# Patient Record
Sex: Male | Born: 2000 | Race: Black or African American | Hispanic: No | Marital: Single | Smoking: Never smoker
Health system: Southern US, Community
[De-identification: ages and names within clinical notes are randomized; demographics above are authoritative.]

## PROBLEM LIST (undated history)

## (undated) DIAGNOSIS — L732 Hidradenitis suppurativa: Secondary | ICD-10-CM

## (undated) HISTORY — DX: Hidradenitis suppurativa: L73.2

---
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
## 2019-08-12 ENCOUNTER — Encounter (HOSPITAL_COMMUNITY): Payer: Self-pay | Admitting: Emergency Medicine

## 2019-08-12 ENCOUNTER — Emergency Department (HOSPITAL_COMMUNITY)
Admission: EM | Admit: 2019-08-12 | Discharge: 2019-08-13 | Disposition: A | Discharge status: Home / Self Care | Payer: Self-pay | Attending: Emergency Medicine | Admitting: Emergency Medicine

## 2019-08-12 DIAGNOSIS — Z5321 Procedure and treatment not carried out due to patient leaving prior to being seen by health care provider: Secondary | ICD-10-CM | POA: Insufficient documentation

## 2019-08-12 DIAGNOSIS — L02414 Cutaneous abscess of left upper limb: Secondary | ICD-10-CM | POA: Insufficient documentation

## 2019-08-12 NOTE — ED Triage Notes (Signed)
Pt c/o abscess under left arm for couple days. Repots some drainage from it.

## 2019-08-13 NOTE — ED Notes (Signed)
Pt called for rooming

## 2019-08-16 ENCOUNTER — Other Ambulatory Visit: Payer: Self-pay

## 2019-08-16 ENCOUNTER — Emergency Department (HOSPITAL_COMMUNITY)
Admission: EM | Admit: 2019-08-16 | Discharge: 2019-08-17 | Disposition: A | Discharge status: Home / Self Care | Payer: Self-pay | Attending: Emergency Medicine | Admitting: Emergency Medicine

## 2019-08-16 ENCOUNTER — Encounter (HOSPITAL_COMMUNITY): Payer: Self-pay

## 2019-08-16 DIAGNOSIS — L0291 Cutaneous abscess, unspecified: Secondary | ICD-10-CM

## 2019-08-16 DIAGNOSIS — L02412 Cutaneous abscess of left axilla: Secondary | ICD-10-CM | POA: Insufficient documentation

## 2019-08-16 MED ORDER — ACETAMINOPHEN 500 MG PO TABS
1000.0000 mg | ORAL_TABLET | Freq: Once | ORAL | Status: AC
  Administered 2019-08-16: 1000 mg via ORAL
  Filled 2019-08-16: qty 2

## 2019-08-16 MED ORDER — DOXYCYCLINE HYCLATE 100 MG PO CAPS: 100.0000 mg | ORAL_CAPSULE | Freq: Two times a day (BID) | ORAL | 0 refills | Status: AC

## 2019-08-16 MED ORDER — LIDOCAINE-EPINEPHRINE (PF) 2 %-1:200000 IJ SOLN
10.0000 mL | Freq: Once | INTRAMUSCULAR | Status: AC
  Administered 2019-08-16: 10 mL
  Filled 2019-08-16: qty 20

## 2019-08-16 MED ORDER — DOXYCYCLINE HYCLATE 100 MG PO TABS
100.0000 mg | ORAL_TABLET | Freq: Once | ORAL | Status: AC
  Administered 2019-08-16: 100 mg via ORAL
  Filled 2019-08-16: qty 1

## 2019-08-16 NOTE — ED Triage Notes (Signed)
Arrived POve fro m home. Patient reports abscess under left axilla. Patient was here in this ED a few days ago, but left because of long wait time. Patient states his mother tried to treat abscess at home with "bath salts", but it has gotten worse. Patient reports some drainage from abscess.

## 2019-08-16 NOTE — Discharge Instructions (Signed)
Please take all of your antibiotics until finished!   You may develop abdominal discomfort or diarrhea from the antibiotic.  You may help offset this with probiotics which you can buy or get in yogurt. Do not eat  or take the probiotics until 2 hours after your antibiotic.   Please return to the emergency department or urgent care or primary care doctor office in 48 hours for reevaluation.  You should have wound checks done to make sure the wound is healing well.

## 2019-08-16 NOTE — ED Provider Notes (Signed)
[facility]-EMERGENCY DEPT Provider Note   CSN: [id number] Arrival date & time: 08/16/19  1807     History Chief Complaint  Patient presents with  . Abscess    left axilla    Tommy Higgins is a 19 y.o. male.  HPI  Patient is a 19 year old male with no pertinent past medical history presented today for left axillary abscess.  He states it is achy, severely painful, worse with arm movement.  He states it is progressively worsened over the past 6 days.  He states that he came to the ER for evaluation a few days ago however left because of the wait time.  He has taken nothing for pain prior to arrival.  He states that he attempted to treat the abscess with warm salt water soaks and warm compresses but he states that it has continued to worsen.  He denies any nausea, vomiting, fevers, chills.  Denies any crepitations fatigue lightheadedness or dizziness.     History reviewed. No pertinent past medical history.  There are no problems to display for this patient.   No past surgical history on file.     No family history on file.  Social History   Tobacco Use  . Smoking status: Never Smoker  . Smokeless tobacco: Never Used  Substance Use Topics  . Alcohol use: Not on file  . Drug use: Not on file    Home Medications Prior to Admission medications   Medication Sig Start Date End Date Taking? Authorizing Provider  doxycycline (VIBRAMYCIN) 100 MG capsule Take 1 capsule (100 mg total) by mouth 2 (two) times daily for 10 days. 08/16/19 08/26/19  Gailen Shelter, PA  guaiFENesin (MUCINEX) 600 MG 12 hr tablet Take 600 mg by mouth 2 (two) times daily as needed for cough. Patient not taking: Reported on 08/16/2019    [provider]  ibuprofen (ADVIL,MOTRIN) 600 MG tablet Take 1 tablet (600 mg total) by mouth every 6 (six) hours as needed. Patient not taking: Reported on 08/16/2019 05/10/16   Ronnell Freshwater, NP    Allergies    Patient  has no known allergies.  Review of Systems   Review of Systems  Constitutional: Negative for chills and fever.  HENT: Negative for congestion.   Eyes: Negative for pain.  Respiratory: Negative for cough and shortness of breath.   Cardiovascular: Negative for chest pain and leg swelling.  Gastrointestinal: Negative for abdominal pain and vomiting.  Genitourinary: Negative for dysuria.  Musculoskeletal: Negative for myalgias.       Left armpit pain  Skin: Negative for rash.       Abscess  Neurological: Negative for dizziness and headaches.    Physical Exam Updated Vital Signs BP 125/84 (BP Location: Right Arm)   Pulse 89   Temp 98.4 F (36.9 C) (Oral)   Resp 20   Ht 6\' 2"  (1.88 m)   Wt 104.3 kg   SpO2 100%   BMI 29.53 kg/m   Physical Exam Vitals and nursing note reviewed.  Constitutional:      General: He is not in acute distress. HENT:     Head: Normocephalic and atraumatic.     Nose: Nose normal.     Mouth/Throat:     Mouth: Mucous membranes are moist.  Eyes:     General: No scleral icterus. Cardiovascular:     Rate and Rhythm: Normal rate and regular rhythm.     Pulses: Normal pulses.     Heart  sounds: Normal heart sounds.  Pulmonary:     Effort: Pulmonary effort is normal. No respiratory distress.     Breath sounds: No wheezing.  Abdominal:     Palpations: Abdomen is soft.     Tenderness: There is no abdominal tenderness.  Musculoskeletal:     Cervical back: Normal range of motion.     Right lower leg: No edema.     Left lower leg: No edema.  Skin:    General: Skin is warm and dry.     Capillary Refill: Capillary refill takes less than 2 seconds.     Comments: Large fluctuant 4 cm x 5 cm fluctuant area in the left axilla. There is surrounding erythema.  There is very tender to palpation.  Neurological:     Mental Status: He is alert. Mental status is at baseline.  Psychiatric:        Mood and Affect: Mood normal.        Behavior: Behavior normal.      ED Results / Procedures / Treatments   Labs (all labs ordered are listed, but only abnormal results are displayed) Labs Reviewed  AEROBIC CULTURE (SUPERFICIAL SPECIMEN)    EKG None  Radiology No results found.  Procedures .Marland KitchenIncision and Drainage  Date/Time: 08/17/2019 12:39 AM Performed by: Tedd Sias, PA Authorized by: Tedd Sias, PA   Consent:    Consent obtained:  Verbal   Consent given by:  Patient   Risks discussed:  Bleeding, incomplete drainage, pain and damage to other organs   Alternatives discussed:  No treatment Universal protocol:    Procedure explained and questions answered to patient or proxy's satisfaction: yes     Relevant documents present and verified: yes     Test results available and properly labeled: yes     Imaging studies available: yes     Required blood products, implants, devices, and special equipment available: yes     Site/side marked: yes     Immediately prior to procedure a time out was called: yes     Patient identity confirmed:  Verbally with patient Location:    Type:  Abscess   Size:  5x4cm   Location:  Upper extremity   Upper extremity location: Left axilla. Pre-procedure details:    Skin preparation:  Betadine Anesthesia (see MAR for exact dosages):    Anesthesia method:  Local infiltration   Local anesthetic:  Lidocaine 2% WITH epi Procedure type:    Complexity:  Complex Procedure details:    Incision type: Parallel straight incisions.   Incision depth:  Subcutaneous   Scalpel blade:  11   Wound management:  Probed and deloculated, irrigated with saline and extensive cleaning   Drainage:  Purulent and bloody   Drainage amount:  Copious   Wound treatment:  Wound left open and drain placed (Penrose drain placed) Post-procedure details:    Patient tolerance of procedure:  Tolerated well, no immediate complications   (including critical care time)  Medications Ordered in ED Medications   lidocaine-EPINEPHrine (XYLOCAINE W/EPI) 2 %-1:200000 (PF) injection 10 mL (10 mLs Infiltration Given 08/16/19 2246)  acetaminophen (TYLENOL) tablet 1,000 mg (1,000 mg Oral Given 08/16/19 2246)  doxycycline (VIBRA-TABS) tablet 100 mg (100 mg Oral Given 08/16/19 2246)    ED Course  I have reviewed the triage vital signs and the nursing notes.  Pertinent labs & imaging results that were available during my care of the patient were reviewed by me and considered in my medical decision  making (see chart for details).    MDM Rules/Calculators/A&P                          Patient is 19 year old male with 5 x 4 cm abscess in the left axilla.  No significant systemic symptoms.   Loop drainage technique was used with parallel incisions and a Penrose drain placed.  Purulent drainage was expressed from wound.  Area was deloculated and irrigated.  Penrose drain was placed and tied.  Patient will follow up with his primary care doctor, urgent care or this department in 2 days for wound recheck.  There is significant surrounding erythema patient was placed on doxycycline to cover for MRSA.  He was given wound care instructions and return precautions.  Wound culture obtained as this was an extensive abscess.   Final Clinical Impression(s) / ED Diagnoses Final diagnoses:  Abscess  Abscess of axilla, left    Rx / DC Orders ED Discharge Orders         Ordered    doxycycline (VIBRAMYCIN) 100 MG capsule  2 times daily     Discontinue  Reprint     08/16/19 2330           Gailen Shelter, [place] 08/17/19 0041    Vanetta Mulders, MD 08/25/19 1704

## 2019-08-19 ENCOUNTER — Encounter (HOSPITAL_COMMUNITY): Payer: Self-pay | Admitting: *Deleted

## 2019-08-19 ENCOUNTER — Emergency Department (HOSPITAL_COMMUNITY)
Admission: EM | Admit: 2019-08-19 | Discharge: 2019-08-19 | Disposition: A | Discharge status: Home / Self Care | Payer: Self-pay | Attending: Emergency Medicine | Admitting: Emergency Medicine

## 2019-08-19 ENCOUNTER — Other Ambulatory Visit: Payer: Self-pay

## 2019-08-19 DIAGNOSIS — Z4800 Encounter for change or removal of nonsurgical wound dressing: Secondary | ICD-10-CM | POA: Insufficient documentation

## 2019-08-19 DIAGNOSIS — Z5189 Encounter for other specified aftercare: Secondary | ICD-10-CM

## 2019-08-19 LAB — AEROBIC CULTURE W GRAM STAIN (SUPERFICIAL SPECIMEN)

## 2019-08-19 NOTE — Discharge Instructions (Signed)
Continue with warm compresses, the area will continue to drain and improve.  Make sure you take your entire course of antibiotics.  If you feel like the areas become increasingly painful, swollen, red with worsening drainage return to the ED for reevaluation.

## 2019-08-19 NOTE — ED Triage Notes (Signed)
Pt had abscess drained 2 days and had a drain placed. He was told to come back today and have it removed.

## 2019-08-19 NOTE — ED Provider Notes (Signed)
[facility] DEPT Provider Note   CSN: [id number] Arrival date & time: 08/19/19  2376     History Chief Complaint  Patient presents with  . Wound Check    Tommy Higgins is a 19 y.o. male.  Tommy Higgins is a 19 y.o. male who presents to the ED for wound check.  Patient was seen in the ED 2 days ago and had an abscess of the left axilla drained, and a Penrose loop drain was placed.  Patient returns today to have drain removed.  He reports the area is still draining a bit but is overall improving.  He reports improvement to pain and swelling overall.  Redness is pretty much resolved.  He was prescribed doxycycline and says he has been taking it regularly.  No fevers.  No other aggravating or alleviating factors.        History reviewed. No pertinent past medical history.  There are no problems to display for this patient.   History reviewed. No pertinent surgical history.     No family history on file.  Social History   Tobacco Use  . Smoking status: Never Smoker  . Smokeless tobacco: Never Used  Substance Use Topics  . Alcohol use: Not on file  . Drug use: Not on file    Home Medications Prior to Admission medications   Medication Sig Start Date End Date Taking? Authorizing Provider  doxycycline (VIBRAMYCIN) 100 MG capsule Take 1 capsule (100 mg total) by mouth 2 (two) times daily for 10 days. 08/16/19 08/26/19  Tedd Sias, PA  guaiFENesin (MUCINEX) 600 MG 12 hr tablet Take 600 mg by mouth 2 (two) times daily as needed for cough. Patient not taking: Reported on 08/16/2019    [provider]  ibuprofen (ADVIL,MOTRIN) 600 MG tablet Take 1 tablet (600 mg total) by mouth every 6 (six) hours as needed. Patient not taking: Reported on 08/16/2019 05/10/16   Benjamine Sprague, NP    Allergies    Patient has no known allergies.  Review of Systems   Review of Systems  Constitutional: Negative for chills and  fever.  Skin: Positive for wound.    Physical Exam Updated Vital Signs BP (!) 144/86   Pulse 98   Temp 97.7 F (36.5 C) (Oral)   Resp 18   SpO2 100%   Physical Exam Vitals and nursing note reviewed.  Constitutional:      General: He is not in acute distress.    Appearance: Normal appearance. He is well-developed and normal weight. He is not ill-appearing or diaphoretic.  HENT:     Head: Normocephalic and atraumatic.  Eyes:     General:        Right eye: No discharge.        Left eye: No discharge.  Pulmonary:     Effort: Pulmonary effort is normal. No respiratory distress.  Musculoskeletal:        General: No deformity.     Comments: Left axilla with incision and drainage with loop drain in place, small amount of purulent drainage present, there is still small amount of surrounding induration but patient reports this is overall improved he has no overlying cellulitis and no palpable fluctuance  Skin:    General: Skin is warm and dry.  Neurological:     Mental Status: He is alert.     Coordination: Coordination normal.  Psychiatric:        Mood and Affect: Mood normal.  Behavior: Behavior normal.     ED Results / Procedures / Treatments   Labs (all labs ordered are listed, but only abnormal results are displayed) Labs Reviewed - No data to display  EKG None  Radiology No results found.  Procedures Procedures (including critical care time)  Medications Ordered in ED Medications - No data to display  ED Course  I have reviewed the triage vital signs and the nursing notes.  Pertinent labs & imaging results that were available during my care of the patient were reviewed by me and considered in my medical decision making (see chart for details).    MDM Rules/Calculators/A&P                          19 year old male presents for wound check he was seen 2 days ago for I&D of an abscess to his left axilla, he had a Penrose loop drain placed.  He reports  he still having a little bit of drainage in this area but overall pain is improving. There is still some induration but no fluctuance or surrounding cellulitis. Overall improving, encouraged him to take entire course of antibiotics and continue with warm soaks and compresses.  Return precautions discussed.  Patient discharged home in good condition.  Final Clinical Impression(s) / ED Diagnoses Final diagnoses:  Visit for wound check    Rx / DC Orders ED Discharge Orders    None       Legrand Rams 08/19/19 1038    Raeford Razor, MD 08/22/19 1202

## 2019-08-20 ENCOUNTER — Telehealth: Payer: Self-pay | Admitting: Emergency Medicine

## 2019-08-20 NOTE — Telephone Encounter (Signed)
Post ED Visit - Positive Culture Follow-up  Culture report reviewed by antimicrobial stewardship pharmacist: [facility] []  , Pharm.D. []  Enzo Bi, Pharm.D., BCPS AQ-ID []  , Pharm.D., BCPS []  Celedonio Miyamoto, Pharm.D., BCPS []  [place], Garvin Fila.D., BCPS, AAHIVP []  , Pharm.D., BCPS, AAHIVP []  [place]ina Pillion, PharmD, BCPS []  , PharmD, BCPS []  [place], PharmD, BCPS []  [address], PharmD []  , PharmD, BCPS []  Estella Husk, PharmD  Pharmacy Team []  Lysle Pearl, PharmD []  , PharmD []  Phillips Climes, PharmD [x]  , Rph []  Agapito Games) , PharmD []  Verlan Friends, PharmD []  , PharmD []  Mervyn Gay, PharmD []  , PharmD []  Vinnie Level, PharmD []  [facility], PharmD []  , PharmD []  Len Childs, PharmD   Positive wound culture Treated with doxycycline, organism sensitive to the same and no further patient follow-up is required at this time.  08/20/2019, 11:57 AM

## 2019-09-27 ENCOUNTER — Emergency Department (HOSPITAL_COMMUNITY)
Admission: EM | Admit: 2019-09-27 | Discharge: 2019-09-29 | Disposition: A | Discharge status: Home / Self Care | Payer: Self-pay | Attending: Emergency Medicine | Admitting: Emergency Medicine

## 2019-09-27 ENCOUNTER — Encounter (HOSPITAL_COMMUNITY): Payer: Self-pay

## 2019-09-27 ENCOUNTER — Other Ambulatory Visit: Payer: Self-pay

## 2019-09-27 DIAGNOSIS — L02419 Cutaneous abscess of limb, unspecified: Secondary | ICD-10-CM

## 2019-09-27 DIAGNOSIS — L02412 Cutaneous abscess of left axilla: Secondary | ICD-10-CM | POA: Insufficient documentation

## 2019-09-27 DIAGNOSIS — L02411 Cutaneous abscess of right axilla: Secondary | ICD-10-CM | POA: Insufficient documentation

## 2019-09-27 DIAGNOSIS — L0291 Cutaneous abscess, unspecified: Secondary | ICD-10-CM

## 2019-09-27 MED ORDER — CLINDAMYCIN HCL 150 MG PO CAPS: 300.0000 mg | ORAL_CAPSULE | Freq: Four times a day (QID) | ORAL | 0 refills | Status: AC

## 2019-09-27 MED ORDER — ACETAMINOPHEN 500 MG PO TABS
1000.0000 mg | ORAL_TABLET | Freq: Once | ORAL | Status: AC
  Administered 2019-09-27: 1000 mg via ORAL
  Filled 2019-09-27: qty 2

## 2019-09-27 NOTE — ED Triage Notes (Signed)
He states he had an I & D of a left axillary abscess "in the middle of June". He is here today with c/o bilat. Axillae have open, small draining areas. He is in no distress.

## 2019-09-27 NOTE — ED Provider Notes (Signed)
[facility]-EMERGENCY DEPT Provider Note   CSN: [id number] Arrival date & time: 09/27/19  1336     History Chief Complaint  Patient presents with   Wound Check    Tommy Higgins is a 19 y.o. male.  HPI Patient is a 19 year old male presented today with concerns over bilateral armpit abscesses.  He states that he was seen in ER approximately 1 month ago and had incision and drainage done of left armpit abscess.  He states since that time he has had continued drainage.  He states his left arm but now feels significantly better although does have occasionally some clearish white discharge.  He states that he is primarily having pain and swelling in his right armpit now.  He denies any purulent drainage from his right armpit but states he has had some bleeding.  He states that his family members have a history of hidradenitis suppurativa but he has had no symptoms prior to a month ago.      History reviewed. No pertinent past medical history.  There are no problems to display for this patient.   No past surgical history on file.     No family history on file.  Social History   Tobacco Use   Smoking status: Never Smoker   Smokeless tobacco: Never Used  Substance Use Topics   Alcohol use: Not on file   Drug use: Not on file    Home Medications Prior to Admission medications   Medication Sig Start Date End Date Taking? Authorizing Provider  clindamycin (CLEOCIN) 150 MG capsule Take 2 capsules (300 mg total) by mouth every 6 (six) hours for 7 days. 09/27/19 10/04/19  Gailen Shelter, PA  guaiFENesin (MUCINEX) 600 MG 12 hr tablet Take 600 mg by mouth 2 (two) times daily as needed for cough. Patient not taking: Reported on 08/16/2019    [provider]  ibuprofen (ADVIL,MOTRIN) 600 MG tablet Take 1 tablet (600 mg total) by mouth every 6 (six) hours as needed. Patient not taking: Reported on 08/16/2019 05/10/16   Ronnell Freshwater, NP     Allergies    Patient has no known allergies.  Review of Systems   Review of Systems  Constitutional: Negative for chills and fever.  HENT: Negative for congestion.   Eyes: Negative for pain.  Respiratory: Negative for cough and shortness of breath.   Cardiovascular: Negative for chest pain and leg swelling.  Gastrointestinal: Negative for abdominal pain and vomiting.  Genitourinary: Negative for dysuria.  Musculoskeletal: Negative for myalgias.       Armpit pain/bleeding/swelling  Skin: Negative for rash.  Neurological: Negative for dizziness and headaches.    Physical Exam Updated Vital Signs BP (!) 133/77 (BP Location: Right Arm)    Pulse (!) 118    Temp 99 F (37.2 C) (Oral)    Resp 16    SpO2 99%   Physical Exam Vitals and nursing note reviewed.  Constitutional:      General: He is not in acute distress. HENT:     Head: Normocephalic and atraumatic.     Nose: Nose normal.  Eyes:     General: No scleral icterus. Cardiovascular:     Rate and Rhythm: Normal rate and regular rhythm.     Pulses: Normal pulses.     Heart sounds: Normal heart sounds.     Comments: Bilateral radial pulses 3+ and symmetric.  Heart rate 110. Pulmonary:     Effort: Pulmonary effort is normal. No  respiratory distress.     Breath sounds: No wheezing.  Abdominal:     Palpations: Abdomen is soft.     Tenderness: There is no abdominal tenderness.  Musculoskeletal:     Cervical back: Normal range of motion.     Right lower leg: No edema.     Left lower leg: No edema.     Comments: Pain with range of motion of bilateral arms-full range of motion present with discomfort.  There is swelling in bilateral armpits with some inflamed appearing skin.  Right armpit with irritated areas with no distinct abscess. Scant bleeding from skin fold.   Bilateral armpits are tender to palpation. No warmth to touch.   Left armpit with wound.  See attached picture.  There is approximately 1 cm in diameter.  No  purulent drainage present.  Skin:    General: Skin is warm and dry.     Capillary Refill: Capillary refill takes less than 2 seconds.     Comments: See MSK  Neurological:     Mental Status: He is alert. Mental status is at baseline.  Psychiatric:        Mood and Affect: Mood normal.        Behavior: Behavior normal.            ED Results / Procedures / Treatments   Labs (all labs ordered are listed, but only abnormal results are displayed) Labs Reviewed - No data to display  EKG None  Radiology No results found.  Procedures Procedures (including critical care time)  Medications Ordered in ED Medications  acetaminophen (TYLENOL) tablet 1,000 mg (1,000 mg Oral Given 09/27/19 1550)    ED Course  I have reviewed the triage vital signs and the nursing notes.  Pertinent labs & imaging results that were available during my care of the patient were reviewed by me and considered in my medical decision making (see chart for details).    MDM Rules/Calculators/A&P                          Patient is a 19 year old male with a past medical history of abscess in the left armpit presented today with pain in bilateral armpits.   PE significant for evidence of hidradenitis suppurativa.  There is no fluid collection or area that could be incised and drained.  Provided patient with Tylenol and follow-up with surgery.  On review of EMR it appears that patient had MRSA grow out on his wound swab from prior incision and drainage.  Will cover for MRSA with clindamycin.  Patient is mildly tachycardic likely secondary to pain.  I provided patient with 1 dose of Tylenol and will discharge with strict return precautions.  I discussed signs of sepsis with his mother and himself at bedside.  They prefer no further evaluation and work-up at this time.  I have low suspicion for him being septic.  His wounds are well-appearing.  We will provide patient with clindamycin to cover for MRSA as he has  had a history of this and there is some purulent drainage from his left armpit on his report.  He is afebrile but somewhat tachycardic.  Patient states he has not been drinking very much water today.  Will encourage him to drink water and to use Tylenol and ibuprofen for pain.  Given return precautions.  He understands all the symptoms of sepsis.  He states he feels well.  Agreeable to discharge.  Final Clinical  Impression(s) / ED Diagnoses Final diagnoses:  Abscess  Abscess of axilla    Rx / DC Orders ED Discharge Orders         Ordered    clindamycin (CLEOCIN) 150 MG capsule  Every 6 hours     Discontinue  Reprint     09/27/19 1539           Solon Augusta Halbur, [place] 09/27/19 1844    Terrilee Files, MD 09/28/19 1011

## 2019-09-27 NOTE — Discharge Instructions (Addendum)
Please take antibiotics as prescribed. Do not miss doses or discontinue until all pills are taken.   Follow up with general surgeon. Information provided. I have also given you a primary care doctor referral.  Please use Tylenol or ibuprofen for pain.  You may use 600 mg ibuprofen every 6 hours or 1000 mg of Tylenol every 6 hours.  You may choose to alternate between the 2.  This would be most effective.  Not to exceed 4 g of Tylenol within 24 hours.  Not to exceed 3200 mg ibuprofen 24 hours.

## 2019-11-01 ENCOUNTER — Emergency Department (HOSPITAL_COMMUNITY)
Admission: EM | Admit: 2019-11-01 | Discharge: 2019-11-02 | Disposition: A | Discharge status: Home / Self Care | Payer: Self-pay | Attending: Emergency Medicine | Admitting: Emergency Medicine

## 2019-11-01 ENCOUNTER — Encounter (HOSPITAL_COMMUNITY): Payer: Self-pay

## 2019-11-01 ENCOUNTER — Other Ambulatory Visit: Payer: Self-pay

## 2019-11-01 DIAGNOSIS — L0231 Cutaneous abscess of buttock: Secondary | ICD-10-CM | POA: Insufficient documentation

## 2019-11-01 DIAGNOSIS — Z5321 Procedure and treatment not carried out due to patient leaving prior to being seen by health care provider: Secondary | ICD-10-CM | POA: Insufficient documentation

## 2019-11-01 NOTE — ED Triage Notes (Signed)
Arrived POV from home. Patient reports recurrent, worsening abscesses under both arms and between butt cheeks. Patient states his mom has been trying to keep them wrapped up but it isn't helping. Patient reports pain 3/10 after taking Tylenol

## 2019-11-02 NOTE — ED Notes (Signed)
Third call from lobby with no answer

## 2019-11-02 NOTE — ED Notes (Signed)
No answer from lobby x1. 

## 2019-11-03 ENCOUNTER — Emergency Department (HOSPITAL_COMMUNITY)
Admission: EM | Admit: 2019-11-03 | Discharge: 2019-11-04 | Disposition: A | Discharge status: Home / Self Care | Payer: Self-pay | Attending: Emergency Medicine | Admitting: Emergency Medicine

## 2019-11-03 ENCOUNTER — Encounter (HOSPITAL_COMMUNITY): Payer: Self-pay

## 2019-11-03 ENCOUNTER — Other Ambulatory Visit: Payer: Self-pay

## 2019-11-03 DIAGNOSIS — L02412 Cutaneous abscess of left axilla: Secondary | ICD-10-CM | POA: Insufficient documentation

## 2019-11-03 DIAGNOSIS — B9689 Other specified bacterial agents as the cause of diseases classified elsewhere: Secondary | ICD-10-CM | POA: Insufficient documentation

## 2019-11-03 DIAGNOSIS — Z5321 Procedure and treatment not carried out due to patient leaving prior to being seen by health care provider: Secondary | ICD-10-CM | POA: Insufficient documentation

## 2019-11-03 DIAGNOSIS — L02411 Cutaneous abscess of right axilla: Secondary | ICD-10-CM | POA: Insufficient documentation

## 2019-11-03 LAB — COMPREHENSIVE METABOLIC PANEL
ALT: 21 U/L (ref 0–44)
AST: 19 U/L (ref 15–41)
Albumin: 3.5 g/dL (ref 3.5–5.0)
Alkaline Phosphatase: 45 U/L (ref 38–126)
Anion gap: 9 (ref 5–15)
BUN: 20 mg/dL (ref 6–20)
CO2: 27 mmol/L (ref 22–32)
Calcium: 9.2 mg/dL (ref 8.9–10.3)
Chloride: 104 mmol/L (ref 98–111)
Creatinine, Ser: 0.95 mg/dL (ref 0.61–1.24)
GFR calc Af Amer: >60 mL/min (ref >60)
GFR calc non Af Amer: >60 mL/min (ref >60)
Glucose, Bld: 98 mg/dL (ref 70–99)
Potassium: 4.1 mmol/L (ref 3.5–5.1)
Sodium: 140 mmol/L (ref 135–145)
Total Bilirubin: 0.3 mg/dL (ref 0.3–1.2)
Total Protein: 8.8 g/dL — ABNORMAL HIGH (ref 6.5–8.1)

## 2019-11-03 LAB — CBC WITH DIFFERENTIAL/PLATELET
Abs Immature Granulocytes: 0.03 10*3/uL (ref 0.00–0.07)
Basophils Absolute: 0 10*3/uL (ref 0.0–0.1)
Basophils Relative: 0 %
Eosinophils Absolute: 0.1 10*3/uL (ref 0.0–0.5)
Eosinophils Relative: 1 %
HCT: 36.1 % — ABNORMAL LOW (ref 39.0–52.0)
Hemoglobin: 11.1 g/dL — ABNORMAL LOW (ref 13.0–17.0)
Immature Granulocytes: 0 %
Lymphocytes Relative: 20 %
Lymphs Abs: 2.1 10*3/uL (ref 0.7–4.0)
MCH: 23.7 pg — ABNORMAL LOW (ref 26.0–34.0)
MCHC: 30.7 g/dL (ref 30.0–36.0)
MCV: 77 fL — ABNORMAL LOW (ref 80.0–100.0)
Monocytes Absolute: 0.9 10*3/uL (ref 0.1–1.0)
Monocytes Relative: 8 %
Neutro Abs: 7.5 10*3/uL (ref 1.7–7.7)
Neutrophils Relative %: 71 %
Platelets: 577 10*3/uL — ABNORMAL HIGH (ref 150–400)
RBC: 4.69 MIL/uL (ref 4.22–5.81)
RDW: 17.2 % — ABNORMAL HIGH (ref 11.5–15.5)
WBC: 10.6 10*3/uL — ABNORMAL HIGH (ref 4.0–10.5)
nRBC: 0 % (ref 0.0–0.2)

## 2019-11-03 LAB — LACTIC ACID, PLASMA: Lactic Acid, Venous: 1.4 mmol/L (ref 0.5–1.9)

## 2019-11-03 NOTE — ED Triage Notes (Signed)
Patient reports recurrent, worsening abscesses under both arms and between butt cheeks. Patient says his mom has been keeping them wrapped up but they are still oozing, burning and smell. Pt reports 6/10 pain and takes Tylenol for it. He stated he had his left arm abscess surgically cut open 3 months ago and took antibiotics, but after finishing the treatment, the abscesses have not improved.

## 2019-11-04 ENCOUNTER — Other Ambulatory Visit: Payer: Self-pay

## 2019-11-04 ENCOUNTER — Ambulatory Visit
Admission: EM | Admit: 2019-11-04 | Discharge: 2019-11-04 | Disposition: A | Discharge status: Home / Self Care | Payer: Self-pay | Attending: Emergency Medicine | Admitting: Emergency Medicine

## 2019-11-04 ENCOUNTER — Encounter: Payer: Self-pay | Admitting: Emergency Medicine

## 2019-11-04 DIAGNOSIS — L02412 Cutaneous abscess of left axilla: Secondary | ICD-10-CM

## 2019-11-04 DIAGNOSIS — L02411 Cutaneous abscess of right axilla: Secondary | ICD-10-CM

## 2019-11-04 DIAGNOSIS — L0231 Cutaneous abscess of buttock: Secondary | ICD-10-CM

## 2019-11-04 MED ORDER — IBUPROFEN 800 MG PO TABS: 800.0000 mg | ORAL_TABLET | Freq: Three times a day (TID) | ORAL | 0 refills | Status: DC

## 2019-11-04 MED ORDER — DOXYCYCLINE HYCLATE 100 MG PO CAPS: 100.0000 mg | ORAL_CAPSULE | Freq: Two times a day (BID) | ORAL | 0 refills | Status: AC

## 2019-11-04 MED ORDER — HYDROCODONE-ACETAMINOPHEN 5-325 MG PO TABS: 1.0000 | ORAL_TABLET | Freq: Four times a day (QID) | ORAL | 0 refills | Status: DC | PRN

## 2019-11-04 NOTE — Discharge Instructions (Signed)
Doxycycline twice daily for 10 days Use anti-inflammatories for pain/swelling. You may take up to 800 mg Ibuprofen every 8 hours with food. You may supplement Ibuprofen with Tylenol [phone] mg every 8 hours.  Hydrocodone for severe pain Please follow-up with surgery for further evaluation/treatment of recurrent abscess

## 2019-11-04 NOTE — ED Provider Notes (Signed)
[facility]    CSN: [id number] Arrival date & time: 11/04/19  1556      History   Chief Complaint Chief Complaint  Patient presents with   Abscess    HPI Tommy Higgins is a 19 y.o. male presenting today for evaluation of abscesses.  Patient has had recurrent abscesses to bilateral axilla as well as sacral area for approximately 3 months.  Reports improvement with prior I&D and antibiotics, but symptoms returned after cessation of antibiotics.  Denies fevers.  Does report some pain but this is intermittent.  Currently area on buttocks most painful.  Reports actively draining. Denies rectal pain.  HPI  History reviewed. No pertinent past medical history.  There are no problems to display for this patient.   History reviewed. No pertinent surgical history.     Home Medications    Prior to Admission medications   Medication Sig Start Date End Date Taking? Authorizing Provider  doxycycline (VIBRAMYCIN) 100 MG capsule Take 1 capsule (100 mg total) by mouth 2 (two) times daily for 10 days. 11/04/19 11/14/19  Saavi Mceachron C, PA-C  HYDROcodone-acetaminophen (NORCO/VICODIN) 5-325 MG tablet Take 1-2 tablets by mouth every 6 (six) hours as needed for severe pain. 11/04/19   Takeyla Million C, PA-C  ibuprofen (ADVIL) 800 MG tablet Take 1 tablet (800 mg total) by mouth 3 (three) times daily. 11/04/19   Khamari Sheehan, Junius Creamer, PA-C    Family History History reviewed. No pertinent family history.  Social History Social History   Tobacco Use   Smoking status: Never Smoker   Smokeless tobacco: Never Used  Substance Use Topics   Alcohol use: Never   Drug use: Never     Allergies   Patient has no known allergies.   Review of Systems Review of Systems  Constitutional: Negative for fatigue and fever.  Eyes: Negative for redness, itching and visual disturbance.  Respiratory: Negative for shortness of breath.   Cardiovascular: Negative for chest pain and leg  swelling.  Gastrointestinal: Negative for nausea and vomiting.  Musculoskeletal: Negative for arthralgias and myalgias.  Skin: Positive for color change and wound. Negative for rash.  Neurological: Negative for dizziness, syncope, weakness, light-headedness and headaches.     Physical Exam Triage Vital Signs ED Triage Vitals  Enc Vitals Group     BP      Pulse      Resp      Temp      Temp src      SpO2      Weight      Height      Head Circumference      Peak Flow      Pain Score      Pain Loc      Pain Edu?      Excl. in GC?    No data found.  Updated Vital Signs BP 119/78 (BP Location: Left Arm)    Pulse (!) 101    Temp 98.2 F (36.8 C) (Oral)    Resp 18    SpO2 96%   Visual Acuity Right Eye Distance:   Left Eye Distance:   Bilateral Distance:    Right Eye Near:   Left Eye Near:    Bilateral Near:     Physical Exam Vitals and nursing note reviewed.  Constitutional:      Appearance: He is well-developed.     Comments: No acute distress  HENT:     Head: Normocephalic and atraumatic.  Nose: Nose normal.  Eyes:     Conjunctiva/sclera: Conjunctivae normal.  Cardiovascular:     Rate and Rhythm: Normal rate.  Pulmonary:     Effort: Pulmonary effort is normal. No respiratory distress.  Abdominal:     General: There is no distension.  Musculoskeletal:        General: Normal range of motion.     Cervical back: Neck supple.  Skin:    General: Skin is warm and dry.     Comments: Bilateral axilla with areas of induration, multiple areas of opened wounds with pustular drainage, no significant fluctuance  Pilonidal area with open wound draining pustular drainage, no surrounding induration or fluctuance  Left gluteal area with some mild induration and actively draining pustular drainage, tender to palpation  Neurological:     Mental Status: He is alert and oriented to person, place, and time.      UC Treatments / Results  Labs (all labs ordered are  listed, but only abnormal results are displayed) Labs Reviewed - No data to display  EKG   Radiology No results found.  Procedures Procedures (including critical care time)  Medications Ordered in UC Medications - No data to display  Initial Impression / Assessment and Plan / UC Course  I have reviewed the triage vital signs and the nursing notes.  Pertinent labs & imaging results that were available during my care of the patient were reviewed by me and considered in my medical decision making (see chart for details).     Patient may have underlying hidradenitis, reinitiate on doxycycline, lesions do not appear to warrant I&D at this time, actively draining.  Pain management with anti-inflammatories, hydrocodone for severe pain.  Given recurrent nature and persistent symptoms, multiple abscesses recommending follow-up with surgery for further evaluation and treatment of these if persisting.  Discussed strict return precautions. Patient verbalized understanding and is agreeable with plan.  Final Clinical Impressions(s) / UC Diagnoses   Final diagnoses:  Abscess of multiple sites of buttock  Abscesses of both axillae     Discharge Instructions     Doxycycline twice daily for 10 days Use anti-inflammatories for pain/swelling. You may take up to 800 mg Ibuprofen every 8 hours with food. You may supplement Ibuprofen with Tylenol [phone] mg every 8 hours.  Hydrocodone for severe pain Please follow-up with surgery for further evaluation/treatment of recurrent abscess    ED Prescriptions    Medication Sig Dispense Auth. Provider   doxycycline (VIBRAMYCIN) 100 MG capsule Take 1 capsule (100 mg total) by mouth 2 (two) times daily for 10 days. 20 capsule Lyne Khurana C, PA-C   ibuprofen (ADVIL) 800 MG tablet Take 1 tablet (800 mg total) by mouth 3 (three) times daily. 21 tablet Riot Barrick C, PA-C   HYDROcodone-acetaminophen (NORCO/VICODIN) 5-325 MG tablet Take 1-2 tablets  by mouth every 6 (six) hours as needed for severe pain. 8 tablet Meckenzie Balsley, [place] C, PA-C     I have reviewed the PDMP during this encounter.   Lew Dawes, [place] 11/04/19 1651

## 2019-11-04 NOTE — ED Triage Notes (Signed)
Pt sts bilateral axillary abscess that are draining; pt sts one in sacral area

## 2019-11-13 ENCOUNTER — Ambulatory Visit: Payer: Self-pay | Admitting: Nurse Practitioner

## 2019-11-13 NOTE — Progress Notes (Deleted)
11/13/2019 Tommy Higgins [id number] [birth year]   CHIEF COMPLAINT: Rectal bleeding, anemia   HISTORY OF PRESENT ILLNESS:  Tommy Higgins is a 19 year old male with a past medical history of  He presents to our office today for further evaluation for rectal bleeding.   He presented to [facility] ED 08/16/2019 with a left axillary abscess.  His left axilla abscess was drained and a Penrose loop drain was placed.  He was prescribed doxycycline 100 mg twice daily x 10 days.  He returned to the ED on 08/19/2019 to have his axillary wound reassessed and to have the drain removed.  Wound cultures were positive for MRSA. He presented back to [facility] ED on 09/27/2019 with concerns of having bilateral axillary abscesses.  On exam, there was bilateral axillary swelling with inflammation without evidence of any abscesses.  The left axillary wound remained open without purulent drainage.  He was prescribed clindamycin 150 mg 2 capsules 4 times daily for 7 days.  His bilateral axillary pain worsened and he developed a pilonidal abscess he presented to [facility] health urgent care on 11/04/2019.  He was assessed to have bilateral axillary open wounds with purulent drainage, a pilonidal and left gluteal wounds with purulent drainage.  I&D was not required.  He was represcribed doxycycline 100 mg twice daily for 10 days and was instructed to take ibuprofen 800 mg every 8 hours with food, supplement with Tylenol 500 to 1000 mg every 8 hours and Hydrocodone 5/325mg  1 to 2 tabs po Q 6 hrs for severe pain.  He is uninsured.  He does not have a primary care physician.  He presents to our office today due to having rectal bleeding.  Laboratory studies in the ED 11/03/2019 showed a WBC 10.6.  Hemoglobin 11.1.  Hematocrit 36.1.  MCV 77.  Platelet 577.  Tobacco hx? Screen for DM II. Screen for IBD  Adalimuimab obtain Quantiferon   CBC Latest Ref Rng & Units 11/03/2019  WBC 4.0 - 10.5 K/uL 10.6(H)   Hemoglobin 13.0 - 17.0 g/dL 11.1(L)  Hematocrit 39 - 52 % 36.1(L)  Platelets 150 - 400 K/uL 577(H)    CMP Latest Ref Rng & Units 11/03/2019  Glucose 70 - 99 mg/dL 98  BUN 6 - 20 mg/dL 20  Creatinine 4.00 - 8.67 mg/dL 6.19  Sodium 509 - 326 mmol/L 140  Potassium 3.5 - 5.1 mmol/L 4.1  Chloride 98 - 111 mmol/L 104  CO2 22 - 32 mmol/L 27  Calcium 8.9 - 10.3 mg/dL 9.2  Total Protein 6.5 - 8.1 g/dL 7.1(I)  Total Bilirubin 0.3 - 1.2 mg/dL 0.3  Alkaline Phos 38 - 126 U/L 45  AST 15 - 41 U/L 19  ALT 0 - 44 U/L 21           No past medical history on file. No past surgical history on file.  reports that he has never smoked. He has never used smokeless tobacco. He reports that he does not drink alcohol and does not use drugs. family history is not on file. No Known Allergies    Outpatient Encounter Medications as of 11/13/2019  Medication Sig  . doxycycline (VIBRAMYCIN) 100 MG capsule Take 1 capsule (100 mg total) by mouth 2 (two) times daily for 10 days.  Marland Kitchen HYDROcodone-acetaminophen (NORCO/VICODIN) 5-325 MG tablet Take 1-2 tablets by mouth every 6 (six) hours as needed for severe pain.  Marland Kitchen ibuprofen (ADVIL) 800 MG tablet Take 1 tablet (800  mg total) by mouth 3 (three) times daily.   No facility-administered encounter medications on file as of 11/13/2019.     REVIEW OF SYSTEMS: All other systems reviewed and negative except where noted in the History of Present Illness.  Gen: Denies fever, sweats or chills. No weight loss.  CV: Denies chest pain, palpitations or edema. Resp: Denies cough, shortness of breath of hemoptysis.  GI: Denies heartburn, dysphagia, stomach or lower abdominal pain. No diarrhea or constipation.  GU : Denies urinary burning, blood in urine, increased urinary frequency or incontinence. MS: Denies joint pain, muscles aches or weakness. Derm: Denies rash, itchiness, skin lesions or unhealing ulcers. Psych: Denies depression, anxiety, memory loss, suicidal  ideation and confusion. Heme: Denies bruising, bleeding. Neuro:  Denies headaches, dizziness or paresthesias. Endo:  Denies any problems with DM, thyroid or adrenal function.    PHYSICAL EXAM: There were no vitals taken for this visit. General: Well developed ... in no acute distress. Head: Normocephalic and atraumatic. Eyes:  Sclerae non-icteric, conjunctive pink. Ears: Normal auditory acuity. Mouth: Dentition intact. No ulcers or lesions.  Neck: Supple, no lymphadenopathy or thyromegaly.  Lungs: Clear bilaterally to auscultation without wheezes, crackles or rhonchi. Heart: Regular rate and rhythm. No murmur, rub or gallop appreciated.  Abdomen: Soft, nontender, non distended. No masses. No hepatosplenomegaly. Normoactive bowel sounds x 4 quadrants.  Rectal:  Musculoskeletal: Symmetrical with no gross deformities. Skin: Warm and dry. No rash or lesions on visible extremities. Extremities: No edema. Neurological: Alert oriented x 4, no focal deficits.  Psychological:  Alert and cooperative. Normal mood and affect.  ASSESSMENT AND PLAN:  82. 19 year old male with hidradenitis suppurativa    CC:  No ref. provider found

## 2019-11-15 ENCOUNTER — Encounter (HOSPITAL_COMMUNITY): Payer: Self-pay | Admitting: Emergency Medicine

## 2019-11-15 ENCOUNTER — Other Ambulatory Visit: Payer: Self-pay

## 2019-11-15 ENCOUNTER — Emergency Department (HOSPITAL_COMMUNITY)
Admission: EM | Admit: 2019-11-15 | Discharge: 2019-11-16 | Disposition: A | Discharge status: Home / Self Care | Payer: Self-pay | Attending: Emergency Medicine | Admitting: Emergency Medicine

## 2019-11-15 DIAGNOSIS — L02412 Cutaneous abscess of left axilla: Secondary | ICD-10-CM | POA: Insufficient documentation

## 2019-11-15 DIAGNOSIS — L02411 Cutaneous abscess of right axilla: Secondary | ICD-10-CM | POA: Insufficient documentation

## 2019-11-15 DIAGNOSIS — Z5321 Procedure and treatment not carried out due to patient leaving prior to being seen by health care provider: Secondary | ICD-10-CM | POA: Insufficient documentation

## 2019-11-15 NOTE — ED Triage Notes (Signed)
Patient here from home reporting bilateral reoccurring axillary abscess. Reports that he has been on doxycyline with no relief.

## 2019-11-17 ENCOUNTER — Encounter (HOSPITAL_COMMUNITY): Payer: Self-pay | Admitting: Emergency Medicine

## 2019-11-17 ENCOUNTER — Emergency Department (HOSPITAL_COMMUNITY)
Admission: EM | Admit: 2019-11-17 | Discharge: 2019-11-17 | Disposition: A | Discharge status: Home / Self Care | Payer: Self-pay | Attending: Emergency Medicine | Admitting: Emergency Medicine

## 2019-11-17 DIAGNOSIS — L089 Local infection of the skin and subcutaneous tissue, unspecified: Secondary | ICD-10-CM

## 2019-11-17 DIAGNOSIS — Z79899 Other long term (current) drug therapy: Secondary | ICD-10-CM | POA: Insufficient documentation

## 2019-11-17 DIAGNOSIS — M5441 Lumbago with sciatica, right side: Secondary | ICD-10-CM

## 2019-11-17 DIAGNOSIS — M5431 Sciatica, right side: Secondary | ICD-10-CM | POA: Insufficient documentation

## 2019-11-17 MED ORDER — HYDROCODONE-ACETAMINOPHEN 5-325 MG PO TABS
1.0000 | ORAL_TABLET | Freq: Once | ORAL | Status: AC
  Administered 2019-11-17: 14:00:00 1 via ORAL
  Filled 2019-11-17: qty 1

## 2019-11-17 MED ORDER — LIDOCAINE 5 % EX PTCH
1.0000 | MEDICATED_PATCH | Freq: Once | CUTANEOUS | Status: DC
  Administered 2019-11-17: 14:00:00 1 via TRANSDERMAL
  Filled 2019-11-17: qty 1

## 2019-11-17 MED ORDER — NAPROXEN 500 MG PO TABS: 500.0000 mg | ORAL_TABLET | Freq: Two times a day (BID) | ORAL | 0 refills | Status: DC

## 2019-11-17 MED ORDER — MUPIROCIN CALCIUM 2 % EX CREA
TOPICAL_CREAM | Freq: Once | CUTANEOUS | Status: DC
  Filled 2019-11-17: qty 15

## 2019-11-17 MED ORDER — METHOCARBAMOL 500 MG PO TABS: 500.0000 mg | ORAL_TABLET | Freq: Two times a day (BID) | ORAL | 0 refills | Status: DC

## 2019-11-17 MED ORDER — METHOCARBAMOL 500 MG PO TABS
500.0000 mg | ORAL_TABLET | Freq: Once | ORAL | Status: AC
  Administered 2019-11-17: 14:00:00 500 mg via ORAL
  Filled 2019-11-17: qty 1

## 2019-11-17 MED ORDER — KETOROLAC TROMETHAMINE 30 MG/ML IJ SOLN
30.0000 mg | Freq: Once | INTRAMUSCULAR | Status: AC
  Administered 2019-11-17: 14:00:00 30 mg via INTRAMUSCULAR
  Filled 2019-11-17: qty 1

## 2019-11-17 MED ORDER — MUPIROCIN 2 % EX OINT
TOPICAL_OINTMENT | Freq: Once | CUTANEOUS | Status: AC
  Filled 2019-11-17: qty 22

## 2019-11-17 NOTE — Discharge Instructions (Signed)
You were seen here today for Back Pain: Low back pain is discomfort in the lower back that may be due to injuries to muscles and ligaments around the spine. Occasionally, it may be caused by a problem to a part of the spine called a disc. Your back pain should be treated with medicines listed below as well as back exercises and this back pain should get better over the next 2 weeks. Most patients get completely well in 4 weeks. It is important to know however, if you develop severe or worsening pain, low back pain with fever, numbness, weakness or inability to walk or urinate, you should return to the ER immediately.  Please follow up with your doctor this week for a recheck if still having symptoms.  HOME INSTRUCTIONS Self - care:  The application of heat can help soothe the pain.  Maintaining your daily activities, including walking (this is encouraged), as it will help you get better faster than just staying in bed. Do not life, push, pull anything more than 10 pounds for the next week. I am attaching back exercises that you can do at home to help facilitate your recovery.   Back Exercises - I have attached a handout on back exercises that can be done at home to help facilitate your recovery.   Medications are also useful to help with pain control.   Acetaminophen.  This medication is generally safe, and found over the counter. Take as directed for your age. You should not take more than 8 of the extra strength (500mg ) pills a day (max dose is 4000mg  total OVER one day)  Non steroidal anti inflammatory: This includes medications including Ibuprofen, naproxen and Mobic; These medications help both pain and swelling and are very useful in treating back pain.  They should be taken with food, as they can cause stomach upset, and more seriously, stomach bleeding. Do not combine the medications.   Lidocaine Patch: Salon Pas lidocaine patches (blue and silver box) can be purchased over the counter and worn  for 12 hours for local pain relief   Muscle relaxants:  These medications can help with muscle tightness that is a cause of lower back pain.  Most of these medications can cause drowsiness, and it is not safe to drive or use dangerous machinery while taking them. They are primarily helpful when taken at night before sleep.  You will need to follow up with your primary healthcare provider or the Orthopedist in 1-2 weeks for reassessment and persistent symptoms.  Be aware that if you develop new symptoms, such as a fever, leg weakness, difficulty with or loss of control of your urine or bowels, abdominal pain, or more severe pain, you will need to seek medical attention and/or return to the Emergency department. Additional Information:  Your vital signs today were: BP 125/81    Pulse 83    Temp 99.4 F (37.4 C) (Oral)    Resp 20    SpO2 99%  If your blood pressure (BP) was elevated above 135/85 this visit, please have this repeated by your doctor within one month. ---------------  You likely have hidradenitis and this is the cause of your recurrent skin infections, clean twice daily with gentle soap and water, avoid using hydrogen peroxide over the skin because it kills both the infected and healthy tissue.  You can use mupirocin ointment.  It is very important that you follow-up with dermatology regarding these infections.  You did not have any today that  looked like they require drainage.  [facility] Dermatologists:   [facility]  3.2 [phone])  Dermatologist  [address] [place] # [place]  [phone]   Dr. Mertha Finders, MD  2.6 [phone])  Dermatologist  [address] [place]  [phone]  [facility]  3.5 (3)  [facility]  [address] [place]  [phone]   [facility]  4.0 (4)  Dermatologist  [address]  [phone]  Janalyn Harder MD  3.0 (2)  Dermatologist  [address]  [phone]  Hoyle Sauer  2.7 (6)   Dermatologist  [address] [place]  [phone]  [place] Amy Y MD  2.0 (1)  Dermatologist  [address] [place]  [phone]  [facility]  5.0 (3)  Doctor  [address]  [phone]

## 2019-11-17 NOTE — ED Provider Notes (Signed)
[facility]-EMERGENCY DEPT Provider Note   CSN: [id number] Arrival date & time: 11/17/19  1039     History Chief Complaint  Patient presents with   Recurrent Skin Infections   Leg Pain    Tommy Higgins is a 19 y.o. male.  Tommy Higgins is a 19 year old male with no significant PMH who presents today with a chief complaint of low back pain and skin abscesses. Patient's mother is present and helps provide the patient history. Patient reports that he was working for [employer] earlier this year and doing a lot of heavy lifting. He believes he injured his back in February of 2021 while lifting TV's and tires without assistance. After the injury he had back spasms that would come and go. In the last month the pain has become constant in the bilateral low back and is now radiating down his right leg. He rates the pain as 10/10 and notes that it is worst at night or when he is getting in and out of bed. Patient notes that it is now uncomfortable to walk and he limps.  Denies numbness tingling or weakness, no loss of bowel or bladder control.  He denies headache, nausea, vomiting, numbness, or loss of sensation.  He is taking Tylenol for the pain. His mother is also concerned about multiple skin lesions in the patient's bilateral axilla and buttocks. She states that he has been seen for this in the past and was told he probably had Hidradenitis suppurativa. He had an I&D of left axilla on 08/16/19. The mother cleans the areas with hydrogen peroxide and dresses them regulary but does not use any antibiotic ointment. He has been on doxycycline for this in the past.        History reviewed. No pertinent past medical history.  There are no problems to display for this patient.   History reviewed. No pertinent surgical history.     No family history on file.  Social History   Tobacco Use   Smoking status: Never Smoker   Smokeless tobacco: Never Used  Substance  Use Topics   Alcohol use: Never   Drug use: Never    Home Medications Prior to Admission medications   Medication Sig Start Date End Date Taking? Authorizing Provider  HYDROcodone-acetaminophen (NORCO/VICODIN) 5-325 MG tablet Take 1-2 tablets by mouth every 6 (six) hours as needed for severe pain. 11/04/19   Wieters, Hallie C, PA-C  ibuprofen (ADVIL) 800 MG tablet Take 1 tablet (800 mg total) by mouth 3 (three) times daily. 11/04/19   Wieters, Hallie C, PA-C  methocarbamol (ROBAXIN) 500 MG tablet Take 1 tablet (500 mg total) by mouth 2 (two) times daily. 11/17/19   Dartha Lodge, PA-C  naproxen (NAPROSYN) 500 MG tablet Take 1 tablet (500 mg total) by mouth 2 (two) times daily. 11/17/19   Dartha Lodge, PA-C    Allergies    Patient has no known allergies.  Review of Systems   Review of Systems  Constitutional: Negative for chills and fever.  Gastrointestinal: Negative for abdominal pain, nausea and vomiting.  Musculoskeletal: Positive for back pain.  Skin: Positive for wound.  Neurological: Negative for weakness and numbness.  All other systems reviewed and are negative.   Physical Exam Updated Vital Signs BP 127/79    Pulse (!) 102    Temp 99.4 F (37.4 C) (Oral)    Resp 18    SpO2 96%   Physical Exam Vitals and nursing note reviewed.  Constitutional:      General: He is not in acute distress.    Appearance: He is well-developed. He is not diaphoretic.  HENT:     Head: Atraumatic.  Eyes:     General:        Right eye: No discharge.        Left eye: No discharge.  Cardiovascular:     Pulses:          Radial pulses are 2+ on the right side and 2+ on the left side.       Dorsalis pedis pulses are 2+ on the right side and 2+ on the left side.       Posterior tibial pulses are 2+ on the right side and 2+ on the left side.  Pulmonary:     Effort: Pulmonary effort is normal. No respiratory distress.  Abdominal:     General: Bowel sounds are normal. There is no distension.      Palpations: Abdomen is soft. There is no mass.     Tenderness: There is no abdominal tenderness. There is no guarding.     Comments: Abdomen soft, nondistended, nontender to palpation in all quadrants without guarding or peritoneal signs, no CVA tenderness bilaterally  Musculoskeletal:     Cervical back: Neck supple.     Comments: Tenderness to palpation over bilateral low back musculature without focal midline tenderness.  Pain made worse with range of motion of the lower extremities, positive straight leg raise on the right.  Skin:    General: Skin is warm and dry.     Capillary Refill: Capillary refill takes less than 2 seconds.     Comments: Multiple draining tracts and wounds noted to bilateral axillae and between the buttocks, no palpable area of fluctuance to suggest newly formed abscess requiring I&D.  Neurological:     Mental Status: He is alert and oriented to person, place, and time.     Comments: Alert, clear speech, following commands. Moving all extremities without difficulty. Bilateral lower extremities with 5/5 strength in proximal and distal muscle groups and with dorsi and plantar flexion. Sensation intact in bilateral lower extremities. 2+ patellar DTRs bilaterally. Ambulatory with steady gait  Psychiatric:        Behavior: Behavior normal.     ED Results / Procedures / Treatments   Labs (all labs ordered are listed, but only abnormal results are displayed) Labs Reviewed - No data to display  EKG None  Radiology No results found.  Procedures Procedures (including critical care time)  Medications Ordered in ED Medications  lidocaine (LIDODERM) 5 % 1 patch (1 patch Transdermal Patch Applied 11/17/19 1336)  ketorolac (TORADOL) 30 MG/ML injection 30 mg (30 mg Intramuscular Given 11/17/19 1333)  methocarbamol (ROBAXIN) tablet 500 mg (500 mg Oral Given 11/17/19 1333)  HYDROcodone-acetaminophen (NORCO/VICODIN) 5-325 MG per tablet 1 tablet (1 tablet Oral Given  11/17/19 1333)  mupirocin ointment (BACTROBAN) 2 % ( Topical Given 11/17/19 1336)    ED Course  I have reviewed the triage vital signs and the nursing notes.  Pertinent labs & imaging results that were available during my care of the patient were reviewed by me and considered in my medical decision making (see chart for details).    MDM Rules/Calculators/A&P                          Normal neurological exam, no evidence of urinary incontinence or retention, pain is consistently reproducible. There  is no evidence of AAA or concern for dissection at this time.   Patient can walk but states is painful.  No loss of bowel or bladder control.  No concern for cauda equina.  No fever, night sweats, weight loss, h/o cancer, IVDU.  Pain treated here in the department with adequate improvement. RICE protocol and pain medicine indicated and discussed with patient. I have also discussed reasons to return immediately to the ER.  Patient expresses understanding and agrees with plan.  Patient also has multiple skin infections in the axillae as well as over the buttocks, several of them are open and draining I do not feel any areas of fluctuance that require incision and drainage, patient has had these for quite some time, last had an I&D in June.  I suspect this is hidradenitis suppurativa, I have provided patient with resources for dermatology follow-up.  Patient provided mupirocin ointment encouraged to clean daily with soap and water, warm compresses as needed.  Patient expresses understanding and agreement  Final Clinical Impression(s) / ED Diagnoses Final diagnoses:  Acute right-sided low back pain with right-sided sciatica  Recurrent infection of skin    Rx / DC Orders ED Discharge Orders         Ordered    methocarbamol (ROBAXIN) 500 MG tablet  2 times daily        11/17/19 1426    naproxen (NAPROSYN) 500 MG tablet  2 times daily        11/17/19 1426           Dartha Lodge, [place] 11/17/19  1430    Sabino Donovan, MD 11/18/19 [phone]

## 2019-11-17 NOTE — ED Triage Notes (Signed)
Patient reports recent bilateral axillary abscess and he now has a boils on his sacrum that he did not mention last time he was seen. Patient c/o lower spine pain that radiates to right leg.    A/Ox4 Wheelchair in triage.

## 2019-12-04 ENCOUNTER — Ambulatory Visit
Admission: EM | Admit: 2019-12-04 | Discharge: 2019-12-04 | Disposition: A | Discharge status: Home / Self Care | Payer: Self-pay | Attending: Emergency Medicine | Admitting: Emergency Medicine

## 2019-12-04 ENCOUNTER — Other Ambulatory Visit: Payer: Self-pay

## 2019-12-04 DIAGNOSIS — G8929 Other chronic pain: Secondary | ICD-10-CM

## 2019-12-04 DIAGNOSIS — M5441 Lumbago with sciatica, right side: Secondary | ICD-10-CM

## 2019-12-04 DIAGNOSIS — M5442 Lumbago with sciatica, left side: Secondary | ICD-10-CM

## 2019-12-04 DIAGNOSIS — L739 Follicular disorder, unspecified: Secondary | ICD-10-CM

## 2019-12-04 MED ORDER — DOXYCYCLINE HYCLATE 100 MG PO CAPS: 100.0000 mg | ORAL_CAPSULE | Freq: Two times a day (BID) | ORAL | 0 refills | Status: AC

## 2019-12-04 MED ORDER — METHOCARBAMOL 500 MG PO TABS: 500.0000 mg | ORAL_TABLET | Freq: Two times a day (BID) | ORAL | 0 refills | Status: AC

## 2019-12-04 MED ORDER — PREDNISONE 10 MG (21) PO TBPK: ORAL_TABLET | Freq: Every day | ORAL | 0 refills | Status: DC

## 2019-12-04 NOTE — Discharge Instructions (Addendum)
Steroid in morning with food: 6-5-4-3-2-1

## 2019-12-04 NOTE — ED Triage Notes (Signed)
Patient states he has had back issues since April but was diagnosed with cyatica recently. Pt has a follow up with sports medicine in the near future. Pt states pain is so bad that he could not sleep and it radiates down his hip and legs bilaterally. Pt is aox4 but was unable to move from the wheelchair without pain.

## 2019-12-04 NOTE — ED Provider Notes (Signed)
[facility]    CSN: [id number] Arrival date & time: 12/04/19  1307      History   Chief Complaint Chief Complaint  Patient presents with  . Back Pain    diagnosed cyatica    HPI Tommy Higgins is a 19 y.o. male  Presenting for multiple concerns: Notes chronic history of folliculitis.  States he is having a mild flare.  Denies active discharge, fever, thoughts, myalgias.  Requesting doxycycline which he is taken in the past with adequate relief.  Has been recommended specialist numerous times, though has not followed up thereof. Patient also notes acute on chronic low back pain.  States he has had sciatic flares as well.  States this is well diagnosed, not routinely followed by orthopedics.  Tends to seek care in the urgent care, ER.  Denies trauma, inciting event or fall.  No saddle or anesthesia, urinary retention or fecal incontinence.  Denies distal extremity numbness or weakness.  States static radiation first occurred on left, then right, now left.  History reviewed. No pertinent past medical history.  There are no problems to display for this patient.   History reviewed. No pertinent surgical history.     Home Medications    Prior to Admission medications   Medication Sig Start Date End Date Taking? Authorizing Provider  ibuprofen (ADVIL) 800 MG tablet Take 1 tablet (800 mg total) by mouth 3 (three) times daily. 11/04/19  Yes Wieters, Hallie C, PA-C  naproxen (NAPROSYN) 500 MG tablet Take 1 tablet (500 mg total) by mouth 2 (two) times daily. 11/17/19  Yes Dartha Lodge, PA-C  doxycycline (VIBRAMYCIN) 100 MG capsule Take 1 capsule (100 mg total) by mouth 2 (two) times daily for 7 days. 12/04/19 12/11/19  Hall-Potvin, [place], PA-C  methocarbamol (ROBAXIN) 500 MG tablet Take 1 tablet (500 mg total) by mouth 2 (two) times daily for 7 days. 12/04/19 12/11/19  Hall-Potvin, [place], PA-C  predniSONE (STERAPRED UNI-PAK 21 TAB) 10 MG (21) TBPK tablet Take by  mouth daily. Take steroid taper as written 12/04/19   Hall-Potvin, [place], PA-C    Family History History reviewed. No pertinent family history.  Social History Social History   Tobacco Use  . Smoking status: Never Smoker  . Smokeless tobacco: Never Used  Vaping Use  . Vaping Use: Never used  Substance Use Topics  . Alcohol use: Never  . Drug use: Never     Allergies   Patient has no known allergies.   Review of Systems As per HPI   Physical Exam Triage Vital Signs ED Triage Vitals  Enc Vitals Group     BP 12/04/19 1329 110/74     Pulse Rate 12/04/19 1329 (!) 106     Resp 12/04/19 1329 19     Temp 12/04/19 1329 98 F (36.7 C)     Temp Source 12/04/19 1329 Oral     SpO2 12/04/19 1329 97 %     Weight --      Height --      Head Circumference --      Peak Flow --      Pain Score 12/04/19 1332 10     Pain Loc --      Pain Edu? --      Excl. in GC? --    No data found.  Updated Vital Signs BP 110/74 (BP Location: Left Arm)   Pulse (!) 106   Temp 98 F (36.7 C) (Oral)   Resp 19  SpO2 97%   Visual Acuity Right Eye Distance:   Left Eye Distance:   Bilateral Distance:    Right Eye Near:   Left Eye Near:    Bilateral Near:     Physical Exam Constitutional:      General: He is not in acute distress. HENT:     Head: Normocephalic and atraumatic.  Eyes:     General: No scleral icterus.    Pupils: Pupils are equal, round, and reactive to light.  Cardiovascular:     Rate and Rhythm: Normal rate.  Pulmonary:     Effort: Pulmonary effort is normal. No respiratory distress.     Breath sounds: No wheezing.  Musculoskeletal:        General: Tenderness present.     Comments: Diffuse lumbar tenderness that spares spinous process, PSIS.  Neurovascular intact.  Overall exam limited secondary patient cooperation due to pain.  Skin:    Coloration: Skin is not jaundiced or pale.     Comments: Left axilla with scattered folliculitis  Neurological:      Mental Status: He is alert and oriented to person, place, and time.      UC Treatments / Results  Labs (all labs ordered are listed, but only abnormal results are displayed) Labs Reviewed - No data to display  EKG   Radiology No results found.  Procedures Procedures (including critical care time)  Medications Ordered in UC Medications - No data to display  Initial Impression / Assessment and Plan / UC Course  I have reviewed the triage vital signs and the nursing notes. Pertinent labs & imaging results that were available during my care of the patient were reviewed by me and considered in my medical decision making (see chart for details).      Afebrile, nontoxic in office today.  No alarm symptoms as mentioned in HPI.  Exam reassuring at this time.  Has been seen multiple times for recurrent, numerous abscesses.  Have clinically suspected HS and given contact information for dermatology.  Provided contact information thereof and stressed importance of following up with that office for further evaluation and management.  We will treat recurrent folliculitis with doxycycline and sciatica with prednisone.  Return precautions discussed, pt & caregiver verbalized understanding and are agreeable to plan. Final Clinical Impressions(s) / UC Diagnoses   Final diagnoses:  Chronic bilateral low back pain with bilateral sciatica  Folliculitis     Discharge Instructions     Steroid in morning with food: 6-5-4-3-2-1    ED Prescriptions    Medication Sig Dispense Auth. Provider   methocarbamol (ROBAXIN) 500 MG tablet Take 1 tablet (500 mg total) by mouth 2 (two) times daily for 7 days. 14 tablet Hall-Potvin, [place], PA-C   predniSONE (STERAPRED UNI-PAK 21 TAB) 10 MG (21) TBPK tablet Take by mouth daily. Take steroid taper as written 21 tablet Hall-Potvin, [place], PA-C   doxycycline (VIBRAMYCIN) 100 MG capsule Take 1 capsule (100 mg total) by mouth 2 (two) times daily for 7 days.  14 capsule Hall-Potvin, [place], PA-C     PDMP not reviewed this encounter.   Hall-Potvin, [place], [place] 12/04/19 1418

## 2019-12-15 ENCOUNTER — Other Ambulatory Visit: Payer: Self-pay

## 2019-12-15 ENCOUNTER — Emergency Department (HOSPITAL_COMMUNITY): Payer: Self-pay

## 2019-12-15 ENCOUNTER — Emergency Department (HOSPITAL_COMMUNITY)
Admission: EM | Admit: 2019-12-15 | Discharge: 2019-12-15 | Disposition: A | Discharge status: Home / Self Care | Payer: Self-pay | Attending: Emergency Medicine | Admitting: Emergency Medicine

## 2019-12-15 DIAGNOSIS — M5441 Lumbago with sciatica, right side: Secondary | ICD-10-CM

## 2019-12-15 DIAGNOSIS — R000 Tachycardia, unspecified: Secondary | ICD-10-CM | POA: Insufficient documentation

## 2019-12-15 DIAGNOSIS — M5432 Sciatica, left side: Secondary | ICD-10-CM | POA: Insufficient documentation

## 2019-12-15 MED ORDER — PREDNISONE 20 MG PO TABS: 40.0000 mg | ORAL_TABLET | Freq: Every day | ORAL | 0 refills | Status: DC

## 2019-12-15 MED ORDER — PREDNISONE 20 MG PO TABS
60.0000 mg | ORAL_TABLET | Freq: Once | ORAL | Status: AC
  Administered 2019-12-15: 60 mg via ORAL
  Filled 2019-12-15: qty 3

## 2019-12-15 MED ORDER — NAPROXEN 500 MG PO TABS
500.0000 mg | ORAL_TABLET | Freq: Once | ORAL | Status: AC
  Administered 2019-12-15: 500 mg via ORAL
  Filled 2019-12-15: qty 1

## 2019-12-15 MED ORDER — DICLOFENAC EPOLAMINE 1.3 % EX PTCH
1.0000 | MEDICATED_PATCH | Freq: Two times a day (BID) | CUTANEOUS | Status: DC
  Administered 2019-12-15: 1 via TRANSDERMAL
  Filled 2019-12-15: qty 1

## 2019-12-15 MED ORDER — METHYL SALICYLATE-LIDO-MENTHOL 4-4-5 % EX PTCH: 1.0000 | MEDICATED_PATCH | Freq: Two times a day (BID) | CUTANEOUS | 0 refills | Status: DC

## 2019-12-15 NOTE — Discharge Instructions (Addendum)
As discussed, today's evaluation has been generally reassuring. You are likely suffering from pain due to lumbosacral radiculopathy.  Please obtain and use the prescribed medication as directed and be sure to follow-up both with our orthopedic colleagues and with the [facility] health and wellness center.  At the other wellness Center, they should be able to help you with longitudinal care.

## 2019-12-15 NOTE — Progress Notes (Signed)
10/11/201 400 pm ED provider sent amb referral to Ortho office. CM contacted pt and mother states they have an appt for 12/18/2019. They will have to pay out of pocket until his SSI/Medicaid starts. States they did not get Lidocaine patches. She will call pharmacy to see what happened to Rx. Ed provider updated. Pt may use an OTC alternative if patches are not available. Isidoro Donning RN CCM, [facility] ED TOC CM [phone]

## 2019-12-15 NOTE — ED Provider Notes (Signed)
[facility]-EMERGENCY DEPT Provider Note   CSN: [id number] Arrival date & time: 12/15/19  0908     History Chief Complaint  Patient presents with  . Sciatica    Tommy Higgins is a 19 y.o. male.  HPI     Patient presents concern for ongoing pain in his low back, right greater than left, with radiation down the right leg. Patient has no history of trauma, states that he is otherwise well.  Beyond a history of hidradenitis suppurativa, which is not currently flaring, he has no medical problems. Not long after beginning work at [employer] he began having episodes of low back pain.  About 2 weeks ago he developed severe low back pain with radiation on the right leg, worse with attempted ambulation.  Line pain was improved after seeing urgent care, being prescribed a course of steroids.  Line he notes that he completed that medication 2 days ago, and has had worsening pain over the last 48 hours or so. No new incontinence, no abdominal pain, no syncope, no fall, no loss of sensation distally.   No past medical history on file.  There are no problems to display for this patient.   No past surgical history on file.     No family history on file.  Social History   Tobacco Use  . Smoking status: Never Smoker  . Smokeless tobacco: Never Used  Vaping Use  . Vaping Use: Never used  Substance Use Topics  . Alcohol use: Never  . Drug use: Never    Home Medications Prior to Admission medications   Medication Sig Start Date End Date Taking? Authorizing Provider  ibuprofen (ADVIL) 200 MG tablet Take 400 mg by mouth every 6 (six) hours as needed for moderate pain.   Yes [provider]  ibuprofen (ADVIL) 800 MG tablet Take 1 tablet (800 mg total) by mouth 3 (three) times daily. Patient not taking: Reported on 12/15/2019 11/04/19   Wieters, Junius Creamer, PA-C  Methyl Salicylate-Lido-Menthol 4-4-5 % PTCH Apply 1 patch topically in the morning and at bedtime.  12/15/19   Gerhard Munch, MD  naproxen (NAPROSYN) 500 MG tablet Take 1 tablet (500 mg total) by mouth 2 (two) times daily. Patient not taking: Reported on 12/15/2019 11/17/19   Dartha Lodge, PA-C  predniSONE (DELTASONE) 20 MG tablet Take 2 tablets (40 mg total) by mouth daily with breakfast. For the next four days 12/15/19   Gerhard Munch, MD    Allergies    Patient has no known allergies.  Review of Systems   Review of Systems  Constitutional:       Per HPI, otherwise negative  HENT:       Per HPI, otherwise negative  Respiratory:       Per HPI, otherwise negative  Cardiovascular:       Per HPI, otherwise negative  Gastrointestinal: Negative for vomiting.  Endocrine:       Negative aside from HPI  Genitourinary:       Neg aside from HPI   Musculoskeletal:       Per HPI, otherwise negative  Skin: Negative.   Neurological: Negative for syncope.    Physical Exam Updated Vital Signs BP 105/64   Pulse 72   Temp 98.2 F (36.8 C) (Oral)   Resp 16   Ht 6' (1.829 m)   Wt 108.9 kg   SpO2 99%   BMI 32.55 kg/m   Physical Exam Vitals and nursing note reviewed.  Constitutional:      General: He is not in acute distress.    Appearance: He is well-developed.  HENT:     Head: Normocephalic and atraumatic.  Eyes:     Conjunctiva/sclera: Conjunctivae normal.  Cardiovascular:     Rate and Rhythm: Regular rhythm. Tachycardia present.  Pulmonary:     Effort: Pulmonary effort is normal. No respiratory distress.     Breath sounds: No stridor.  Abdominal:     General: There is no distension.     Tenderness: There is no abdominal tenderness. There is no guarding.  Musculoskeletal:     Comments: No gross deformities of the lower back, hips, knees, ankles. Patient can flex each hip, though is hesitant to do so on the right secondary to pain reportedly in the right posterior superior hip region.  With passive motion, range she is appropriate.  Both knees grossly unremarkable,  though the patient is less willing to flex the right knee secondary to pain in the low back.   Skin:    General: Skin is warm and dry.  Neurological:     Mental Status: He is alert and oriented to person, place, and time.     Motor: No weakness.  Psychiatric:        Mood and Affect: Mood is anxious.     ED Results / Procedures / Treatments   Labs (all labs ordered are listed, but only abnormal results are displayed) Labs Reviewed - No data to display  EKG None  Radiology DG Lumbar Spine 2-3 Views  Result Date: 12/15/2019 CLINICAL DATA:  Low back and right greater than left leg pain. EXAM: LUMBAR SPINE - 2-3 VIEW COMPARISON:  Thoracic spine radiographs 05/10/2016 FINDINGS: There are 5 non rib-bearing lumbar type vertebrae. Lumbar alignment is normal. Previously shown mild thoracic levoscoliosis is partially visualized today. No fracture is identified. Intervertebral disc space heights are preserved. IMPRESSION: No acute osseous abnormality or significant degenerative changes identified in the lumbar spine. Electronically Signed   By: Sebastian Ache M.D.   On: 12/15/2019 10:28    Procedures Procedures (including critical care time)  Medications Ordered in ED Medications  diclofenac (FLECTOR) 1.3 % 1 patch (1 patch Transdermal Patch Applied 12/15/19 1044)  predniSONE (DELTASONE) tablet 60 mg (60 mg Oral Given 12/15/19 1043)  naproxen (NAPROSYN) tablet 500 mg (500 mg Oral Given 12/15/19 1043)    ED Course  I have reviewed the triage vital signs and the nursing notes.  Pertinent labs & imaging results that were available during my care of the patient were reviewed by me and considered in my medical decision making (see chart for details).     Chart review notable for ED visit last month for similar back pain, similar reassuring evaluation as well as recurrent skin infections at that time.  MDM Rules/Calculators/A&P                          12:58 PM On repeat exam patient is  awake, alert, in no distress.  He has received steroids, anti-inflammatories, and is feeling better.  We discussed x-ray, which was reassuring, no evidence for bony lesions, and the importance of following up with our local orthopedist for additional management of his low back pain with lumbosacral radiculopathy.  Patient may require additional imaging, but this can be performed as an outpatient given his denial of red flag suggesting cauda equina or other central nervous pathology.  Final Clinical Impression(s) / ED  Diagnoses Final diagnoses:  Acute bilateral low back pain with bilateral sciatica    Rx / DC Orders ED Discharge Orders         Ordered    predniSONE (DELTASONE) 20 MG tablet  Daily with breakfast        12/15/19 1257    Methyl Salicylate-Lido-Menthol 4-4-5 % PTCH  2 times daily        12/15/19 1257           Gerhard Munch, MD 12/15/19 1259

## 2019-12-15 NOTE — ED Triage Notes (Signed)
Pt brought in by mother. Pt reports back pain and sciatica since July. Patient hurt his back in march working at [employer].   Pt seen at UC last week and given prednisone and doxycycline. After prednisone finished pain came back.  8/10 pain.   A/O at baseline

## 2019-12-18 ENCOUNTER — Ambulatory Visit: Payer: Self-pay | Admitting: Family Medicine

## 2019-12-19 ENCOUNTER — Telehealth: Payer: Self-pay | Admitting: *Deleted

## 2019-12-19 ENCOUNTER — Ambulatory Visit
Admission: EM | Admit: 2019-12-19 | Discharge: 2019-12-19 | Disposition: A | Discharge status: Home / Self Care | Payer: Self-pay | Attending: Physician Assistant | Admitting: Physician Assistant

## 2019-12-19 ENCOUNTER — Other Ambulatory Visit: Payer: Self-pay

## 2019-12-19 DIAGNOSIS — G8929 Other chronic pain: Secondary | ICD-10-CM

## 2019-12-19 DIAGNOSIS — L739 Follicular disorder, unspecified: Secondary | ICD-10-CM

## 2019-12-19 DIAGNOSIS — M5441 Lumbago with sciatica, right side: Secondary | ICD-10-CM

## 2019-12-19 MED ORDER — TIZANIDINE HCL 2 MG PO TABS: 2.0000 mg | ORAL_TABLET | Freq: Three times a day (TID) | ORAL | 0 refills | Status: DC | PRN

## 2019-12-19 MED ORDER — DOXYCYCLINE HYCLATE 100 MG PO CAPS: 100.0000 mg | ORAL_CAPSULE | Freq: Two times a day (BID) | ORAL | 0 refills | Status: DC

## 2019-12-19 MED ORDER — MELOXICAM 7.5 MG PO TABS: 7.5000 mg | ORAL_TABLET | Freq: Every day | ORAL | 0 refills | Status: DC

## 2019-12-19 NOTE — ED Triage Notes (Signed)
Pt c/o lower back pain radiating down rt leg x2wks. States treated with prednisone a week ago from the ED, states it helped until he finished it. Pt c/o multiple abscesses under both arms since he took the prednisone.

## 2019-12-19 NOTE — Discharge Instructions (Addendum)
Back pain Start Mobic. Do not take ibuprofen (motrin/advil)/ naproxen (aleve) while on mobic. Discontinue methocarbamol (robaxin), and trial tizanidine as needed, this can make you drowsy, so do not take if you are going to drive, operate heavy machinery, or make important decisions. Ice/heat compresses as needed. Follow up with orthopedics as scheduled. If experience numbness/tingling of the inner thighs, loss of bladder or bowel control, go to the emergency department for evaluation.    Abscess/folliculitis Start doxycycline as directed. Keep wound clean and dry. Remember to avoid prednisone until you have been seen by dermatology. If having fever, go to the ED for further evaluation.

## 2019-12-19 NOTE — ED Provider Notes (Signed)
[facility]    CSN: [id number] Arrival date & time: 12/19/19  1223      History   Chief Complaint Chief Complaint  Patient presents with  . Back Pain    HPI Tommy Higgins is a 19 y.o. male.   19 year old male comes in for continued low back pain. This is chronic in nature with recent flare 2 weeks ago. Was seen at the ED 1 week ago with good relief while on prednisone. However, symptoms returned after finishing prednisone. Denies worsening symptoms. Denies saddle anesthesia, new incontinence. Denies abdominal pain, new falls. Had ortho appointment yesterday that he missed, new appointment in 4 days.  States since starting prednisone 1 week ago, has developed multiple abscess to bilateral axilla. History of HS without follow up with dermatology. Denies fevers. Has been dressing area.      History reviewed. No pertinent past medical history.  There are no problems to display for this patient.   History reviewed. No pertinent surgical history.     Home Medications    Prior to Admission medications   Medication Sig Start Date End Date Taking? Authorizing Provider  doxycycline (VIBRAMYCIN) 100 MG capsule Take 1 capsule (100 mg total) by mouth 2 (two) times daily. 12/19/19   Cathie Hoops, [place] Vi V, PA-C  meloxicam (MOBIC) 7.5 MG tablet Take 1 tablet (7.5 mg total) by mouth daily. 12/19/19   Belinda Fisher, PA-C  Methyl Salicylate-Lido-Menthol 4-4-5 % PTCH Apply 1 patch topically in the morning and at bedtime. 12/15/19   Gerhard Munch, MD  tiZANidine (ZANAFLEX) 2 MG tablet Take 1 tablet (2 mg total) by mouth every 8 (eight) hours as needed for muscle spasms. 12/19/19   Belinda Fisher, PA-C    Family History History reviewed. No pertinent family history.  Social History Social History   Tobacco Use  . Smoking status: Never Smoker  . Smokeless tobacco: Never Used  Vaping Use  . Vaping Use: Never used  Substance Use Topics  . Alcohol use: Never  . Drug use: Never      Allergies   Patient has no known allergies.   Review of Systems Review of Systems  Reason unable to perform ROS: See HPI as above.     Physical Exam Triage Vital Signs ED Triage Vitals  Enc Vitals Group     BP 12/19/19 1345 113/68     Pulse Rate 12/19/19 1345 80     Resp 12/19/19 1345 18     Temp 12/19/19 1345 98.2 F (36.8 C)     Temp Source 12/19/19 1345 Oral     SpO2 12/19/19 1345 98 %     Weight --      Height --      Head Circumference --      Peak Flow --      Pain Score 12/19/19 1355 7     Pain Loc --      Pain Edu? --      Excl. in GC? --    No data found.  Updated Vital Signs BP 113/68 (BP Location: Left Arm)   Pulse 80   Temp 98.2 F (36.8 C) (Oral)   Resp 18   SpO2 98%   Physical Exam Constitutional:      General: He is not in acute distress.    Appearance: He is well-developed. He is not diaphoretic.  HENT:     Head: Normocephalic and atraumatic.  Eyes:     Conjunctiva/sclera: Conjunctivae normal.  Pupils: Pupils are equal, round, and reactive to light.  Cardiovascular:     Rate and Rhythm: Normal rate and regular rhythm.     Heart sounds: Normal heart sounds. No murmur heard.  No friction rub. No gallop.   Pulmonary:     Effort: Pulmonary effort is normal. No accessory muscle usage or respiratory distress.     Breath sounds: Normal breath sounds. No stridor. No decreased breath sounds, wheezing, rhonchi or rales.  Musculoskeletal:     Comments: No tenderness on palpation of the spinous processes. Tenderness to right lower lumbar region/hip. Decreased ROM of back and hips due to pain. Right straight leg raise. Sensation intact  Skin:    General: Skin is warm and dry.     Comments: Multiple self draining abscess/folliculitis to bilateral axilla. Limited exam given patient unable to abduct shoulders due to pain. No erythema, induration, warmth.   Neurological:     Mental Status: He is alert and oriented to person, place, and time.      UC Treatments / Results  Labs (all labs ordered are listed, but only abnormal results are displayed) Labs Reviewed - No data to display  EKG   Radiology No results found.  Procedures Procedures (including critical care time)  Medications Ordered in UC Medications - No data to display  Initial Impression / Assessment and Plan / UC Course  I have reviewed the triage vital signs and the nursing notes.  Pertinent labs & imaging results that were available during my care of the patient were reviewed by me and considered in my medical decision making (see chart for details).    1. 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 Will trial mobic. Switch robaxin to tizanidine for symptomatic management. However, discussed should avoid systemic corticosteroid for now until abscesses clear as per below. Return precautions given. Otherwise to follow up as scheduled with ortho.  2. Folliculitis/abscess to the axilla Self draining. Will start doxycycline as directed. Wound care instructions given. Will have patient follow up with dermatology for further evaluation. Return precautions given.  Final Clinical Impressions(s) / UC Diagnoses   Final diagnoses:  Chronic bilateral low back pain with right-sided sciatica  Folliculitis of axilla    ED Prescriptions    Medication Sig Dispense Auth. Provider   meloxicam (MOBIC) 7.5 MG tablet  (Status: Discontinued) Take 1 tablet (7.5 mg total) by mouth daily. 10 tablet Ginelle Bays V, PA-C   tiZANidine (ZANAFLEX) 2 MG tablet  (Status: Discontinued) Take 1 tablet (2 mg total) by mouth every 8 (eight) hours as needed for muscle spasms. 15 tablet Tanner Yeley V, PA-C   doxycycline (VIBRAMYCIN) 100 MG capsule  (Status: Discontinued) Take 1 capsule (100 mg total) by mouth 2 (two) times daily. 14 capsule Naresh Althaus V, PA-C   doxycycline (VIBRAMYCIN) 100 MG capsule Take 1 capsule (100 mg total) by mouth 2 (two) times  daily. 14 capsule Nesiah Jump V, PA-C   meloxicam (MOBIC) 7.5 MG tablet Take 1 tablet (7.5 mg total) by mouth daily. 10 tablet Kahlie Deutscher V, PA-C   tiZANidine (ZANAFLEX) 2 MG tablet Take 1 tablet (2 mg total) by mouth every 8 (eight) hours as needed for muscle spasms. 15 tablet Belinda Fisher, PA-C     PDMP not reviewed this encounter.   Belinda Fisher, PA-C 12/19/19 1708

## 2019-12-19 NOTE — Telephone Encounter (Signed)
TOC CM received a call from pt's mother, stating he missed his appt on yesterday with Orthopedic MD, Dr Lavada Mesi, [phone]. Contacted office and rescheduled appt for 10/19 at 340 pm. Mother is in agreement with appt. Pt is at urgent care in pain and he ran out of prednisone. She does plan to ensure pt keeps appt with Ortho MD. Provided mother with address and time to office. Isidoro Donning RN CCM, [facility] ED TOC CM [phone]

## 2019-12-23 ENCOUNTER — Ambulatory Visit: Payer: Self-pay | Admitting: Family Medicine

## 2019-12-25 ENCOUNTER — Other Ambulatory Visit: Payer: Self-pay

## 2019-12-25 ENCOUNTER — Ambulatory Visit: Payer: Self-pay | Admitting: Family Medicine

## 2019-12-25 ENCOUNTER — Encounter: Payer: Self-pay | Admitting: Family Medicine

## 2019-12-25 DIAGNOSIS — L732 Hidradenitis suppurativa: Secondary | ICD-10-CM | POA: Insufficient documentation

## 2019-12-25 DIAGNOSIS — M5442 Lumbago with sciatica, left side: Secondary | ICD-10-CM

## 2019-12-25 DIAGNOSIS — G8929 Other chronic pain: Secondary | ICD-10-CM

## 2019-12-25 DIAGNOSIS — M5441 Lumbago with sciatica, right side: Secondary | ICD-10-CM

## 2019-12-25 MED ORDER — PREDNISONE 10 MG PO TABS: ORAL_TABLET | ORAL | 0 refills | Status: DC

## 2019-12-25 NOTE — Progress Notes (Signed)
Office Visit Note   Patient: Tommy Higgins           Date of Birth: [birth year]           MRN: [id number] Visit Date: 12/25/2019 Requested by: Gerhard Munch, MD [address] [place],  [place] [address] PCP: Patient, No Pcp Per  Subjective: Chief Complaint  Patient presents with   Lower Back - Pain    Pain radiating down the back of the legs, occ right but now on the left. 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 Worse past 2 months.     HPI: He is here with low back pain.  Symptoms started around February, no definite injury.  He was working at [employer] at the time doing a lot of lifting.  He started having increasing pain in his low back with first radiation down his right leg, and later radiation down his left leg.  Pain got steadily worse, he went to urgent care and had x-rays which were unrevealing.  He has also developed hidradenitis suppurativa this past year and has had a lot of difficulty with it.  He has had lesions on his arms, in his axilla, the buttocks and the intertriginous areas.  There is a family history of this.  At one point he was given 3 days of prednisone and during those 3 days, he felt excellent.  His skin lesions improved and his back pain was dramatically better.  As soon as the medicine ended, his severe symptoms returned.  He is hardly able to move because of pain.  He is no longer able to work.  It is very difficult for him to even get out of bed or a chair.  Denies any gastrointestinal symptoms.               ROS: No fevers or chills.  All other systems were reviewed and are negative.  Objective: Vital Signs: There were no vitals taken for this visit.  Physical Exam:  General:  Alert and oriented, in no acute distress. Pulm:  Breathing unlabored. Psy:  Normal mood, congruent affect. Skin: There is an open wound in the midline upper gluteal area.  No surrounding cellulitis. Back: He is very tender to palpation near the  left SI joint and in the left gluteus medius region.  Straight leg raise is equivocal.  Lower extremity strength and reflexes remain normal.   Imaging: None today.  Recent x-rays were reviewed showing normal bony anatomy.    Assessment & Plan: 1.  Severe low back pain, cannot rule out lumbar disc protrusion.  With open wounds related to hidradenitis suppurativa, cannot rule out infection.  Inflammatory arthropathy is another consideration related to his autoimmune disorder. -I will put him on another slightly longer course of prednisone.  He understands that this is not a safe long-term solution. -I will refer him to dermatology for help in managing this. -MRI scan lumbar spine ordered. -At some point I think lab evaluation would also be beneficial.  He is self-pay at this point, so cost is certainly an issue.     Procedures: No procedures performed  No notes on file     PMFS History: Patient Active Problem List   Diagnosis Date Noted   Hydradenitis 12/25/2019   History reviewed. No pertinent past medical history.  History reviewed. No pertinent family history.  History reviewed. No pertinent surgical history. Social History   Occupational History   Not on file  Tobacco  Use   Smoking status: Never Smoker   Smokeless tobacco: Never Used  Vaping Use   Vaping Use: Never used  Substance and Sexual Activity   Alcohol use: Never   Drug use: Never   Sexual activity: Not Currently

## 2020-01-11 ENCOUNTER — Ambulatory Visit
Admission: RE | Admit: 2020-01-11 | Discharge: 2020-01-11 | Disposition: A | Discharge status: Home / Self Care | Payer: Self-pay | Source: Ambulatory Visit | Attending: Family Medicine | Admitting: Family Medicine

## 2020-01-11 DIAGNOSIS — M5442 Lumbago with sciatica, left side: Secondary | ICD-10-CM

## 2020-01-11 DIAGNOSIS — G8929 Other chronic pain: Secondary | ICD-10-CM

## 2020-01-12 ENCOUNTER — Telehealth: Payer: Self-pay | Admitting: Family Medicine

## 2020-01-12 DIAGNOSIS — L732 Hidradenitis suppurativa: Secondary | ICD-10-CM

## 2020-01-12 NOTE — Telephone Encounter (Signed)
I called. No answer and mailbox was full. Will try again tomorrow.

## 2020-01-12 NOTE — Telephone Encounter (Signed)
MRI looks good - no sign of infection in the spine area.

## 2020-01-14 NOTE — Telephone Encounter (Signed)
I called and advised Mom of this. Hopefully, she will be getting a call soon about an earlier dermatology appointment.

## 2020-01-14 NOTE — Telephone Encounter (Signed)
Not that I know of.  I put the referral order as urgent.

## 2020-01-14 NOTE — Telephone Encounter (Signed)
Urgent derm referral made.

## 2020-01-14 NOTE — Telephone Encounter (Signed)
I called and spoke to the patient's mom. The patient is feeling better with the prednisone - has 10 days left. He does not see the dermatologist, Dr. Jorja Loa, until March of next year. First, is there something else to take once he is finished with this Rx of prednisone? Second, can we refer him to a different dermatologist, to try to get in sooner?

## 2020-01-14 NOTE — Addendum Note (Signed)
Addended by: Lillia Carmel on: 01/14/2020 03:13 PM   Modules accepted: Orders

## 2020-01-14 NOTE — Telephone Encounter (Signed)
Is there anything else to take once the prednisone is finished, while waiting to see the dermatologist?

## 2020-02-03 ENCOUNTER — Ambulatory Visit
Admission: EM | Admit: 2020-02-03 | Discharge: 2020-02-03 | Disposition: A | Discharge status: Home / Self Care | Payer: Self-pay | Attending: Emergency Medicine | Admitting: Emergency Medicine

## 2020-02-03 ENCOUNTER — Telehealth: Payer: Self-pay | Admitting: Family Medicine

## 2020-02-03 ENCOUNTER — Encounter: Payer: Self-pay | Admitting: Emergency Medicine

## 2020-02-03 DIAGNOSIS — M5442 Lumbago with sciatica, left side: Secondary | ICD-10-CM

## 2020-02-03 DIAGNOSIS — L732 Hidradenitis suppurativa: Secondary | ICD-10-CM

## 2020-02-03 MED ORDER — TRAMADOL HCL 50 MG PO TABS: 50.0000 mg | ORAL_TABLET | Freq: Two times a day (BID) | ORAL | 0 refills | Status: AC | PRN

## 2020-02-03 MED ORDER — METHYLPREDNISOLONE SODIUM SUCC 125 MG IJ SOLR
125.0000 mg | Freq: Once | INTRAMUSCULAR | Status: AC
  Administered 2020-02-03: 125 mg via INTRAMUSCULAR

## 2020-02-03 MED ORDER — DOXYCYCLINE HYCLATE 100 MG PO CAPS: 100.0000 mg | ORAL_CAPSULE | Freq: Two times a day (BID) | ORAL | 0 refills | Status: AC

## 2020-02-03 NOTE — Telephone Encounter (Signed)
Called pt mom Tommy Higgins and she is aware of appt scheduled [facility] at 100pm at the [place] location. Pt mom was happy to have gotten appt this soon. Was very thankful.

## 2020-02-03 NOTE — Telephone Encounter (Signed)
Tramadol Rx sent 

## 2020-02-03 NOTE — Telephone Encounter (Signed)
Need to speak to triage nurse

## 2020-02-03 NOTE — Telephone Encounter (Signed)
Please see urgent care note and advise. Tommy Higgins has spoken to Mom today - was able to get an appointment with Dr. Melida Quitter at [facility] this Friday, 12/03. Mom said Tylenol is not helping the patient's pain - requesting something stronger.

## 2020-02-03 NOTE — ED Triage Notes (Signed)
Pt c/o his HS skin disease has flared up x1wk, abscesses/wounds under both arms. Pt also c/o lower back pain radiating down lt leg for the past few days. States had extra prednisone and took 2 doses. Denies new injury.

## 2020-02-03 NOTE — Telephone Encounter (Signed)
Triage nurse advised me to send message to Dr. Prince Rome. Patient was seen at Urgent care and was given an injection for left side sciatica pains. Mother of patient is asking for Dr. Prince Rome to call in pain meds for patient. Patient's mother Marcelino Duster phone number is [phone].

## 2020-02-03 NOTE — Telephone Encounter (Signed)
Referral for Dermatology was faxed to [facility].

## 2020-02-03 NOTE — Telephone Encounter (Signed)
Duplicate message. 

## 2020-02-03 NOTE — ED Provider Notes (Signed)
[facility]    CSN: [id number] Arrival date & time: 02/03/20  1012      History   Chief Complaint Chief Complaint  Patient presents with  . Abscess  . Back Pain    HPI Tommy Higgins is a 19 y.o. male  Presenting for bilateral axillary hidradenitis flare x1 week.  Has been doing hot compresses, keeping areas clean and dry.  Has had some spontaneous drainage.  No chest pain, fever.  Has dermatology appointment in March and intends to keep this. Patient also notes low back pain with sciatica down to mid thigh on left side for the last few days.  Had a few extra prednisone tablets from his orthopedic provider so he took 2 doses with some relief.  Requesting refill today.  History reviewed. No pertinent past medical history.  Patient Active Problem List   Diagnosis Date Noted  . Hydradenitis 12/25/2019    History reviewed. No pertinent surgical history.     Home Medications    Prior to Admission medications   Medication Sig Start Date End Date Taking? Authorizing Provider  doxycycline (VIBRAMYCIN) 100 MG capsule Take 1 capsule (100 mg total) by mouth 2 (two) times daily for 7 days. 02/03/20 02/10/20  Hall-Potvin, [place], PA-C    Family History History reviewed. No pertinent family history.  Social History Social History   Tobacco Use  . Smoking status: Never Smoker  . Smokeless tobacco: Never Used  Vaping Use  . Vaping Use: Never used  Substance Use Topics  . Alcohol use: Never  . Drug use: Never     Allergies   Patient has no known allergies.   Review of Systems Review of Systems  Constitutional: Negative for fatigue and fever.  Respiratory: Negative for cough and shortness of breath.   Cardiovascular: Negative for chest pain and palpitations.  Gastrointestinal: Negative for abdominal pain, diarrhea and vomiting.  Musculoskeletal: Positive for back pain. Negative for arthralgias, gait problem, myalgias, neck pain and neck stiffness.   Skin: Positive for wound. Negative for rash.  Neurological: Negative for speech difficulty and headaches.  All other systems reviewed and are negative.    Physical Exam Triage Vital Signs ED Triage Vitals [02/03/20 1036]  Enc Vitals Group     BP 116/77     Pulse Rate (!) 115     Resp 18     Temp 98.4 F (36.9 C)     Temp Source Oral     SpO2 95 %     Weight      Height      Head Circumference      Peak Flow      Pain Score 8     Pain Loc      Pain Edu?      Excl. in GC?    No data found.  Updated Vital Signs BP 116/77 (BP Location: Left Arm)   Pulse (!) 115   Temp 98.4 F (36.9 C) (Oral)   Resp 18   SpO2 95%   Visual Acuity Right Eye Distance:   Left Eye Distance:   Bilateral Distance:    Right Eye Near:   Left Eye Near:    Bilateral Near:     Physical Exam Constitutional:      General: He is not in acute distress. HENT:     Head: Normocephalic and atraumatic.  Eyes:     General: No scleral icterus.    Pupils: Pupils are equal, round, and reactive to  light.  Cardiovascular:     Rate and Rhythm: Normal rate.  Pulmonary:     Effort: Pulmonary effort is normal. No respiratory distress.     Breath sounds: No wheezing.  Musculoskeletal:     Cervical back: Normal.     Thoracic back: Normal.     Lumbar back: Tenderness present. No swelling, deformity, spasms or bony tenderness. Positive left straight leg raise test. Negative right straight leg raise test.  Skin:    Coloration: Skin is not jaundiced or pale.     Comments: Multiple, scattered bilateral axillary lesions without fluctuance.  Scarring noted from recurrent HS.  Some active drainage that is purulent.  Neurological:     Mental Status: He is alert and oriented to person, place, and time.      UC Treatments / Results  Labs (all labs ordered are listed, but only abnormal results are displayed) Labs Reviewed - No data to display  EKG   Radiology No results found.  Procedures  Procedures (including critical care time)  Medications Ordered in UC Medications  methylPREDNISolone sodium succinate (SOLU-MEDROL) 125 mg/2 mL injection 125 mg (has no administration in time range)    Initial Impression / Assessment and Plan / UC Course  I have reviewed the triage vital signs and the nursing notes.  Pertinent labs & imaging results that were available during my care of the patient were reviewed by me and considered in my medical decision making (see chart for details).     H&P consistent with acute low back pain of left side with sciatica.  Patient followed by orthopedics.  Willing to give Solu-Medrol in office: Tolerated well.  Will follow up with orthopedics for further evaluation management as needed.  Regarding hidradenitis: Has appointment pending with dermatology and intends to keep this.  Will start doxycycline which patient has tolerated well in the past today.  Return precautions discussed, pt verbalized understanding and is agreeable to plan. Final Clinical Impressions(s) / UC Diagnoses   Final diagnoses:  Hydradenitis  Acute left-sided low back pain with left-sided sciatica     Discharge Instructions     Keep area(s) clean and dry. Apply hot compress / towel for 5-10 minutes 3-5 times daily. Take antibiotic as prescribed with food - important to complete course. Return for worsening pain, redness, swelling, discharge, fever.  Helpful prevention tips: Keep nails short to avoid secondary skin infections. Use new, clean razors when shaving. Avoid antiperspirants - look for deodorants without aluminum. Avoid wearing underwire bras as this can irritate the area further.     ED Prescriptions    Medication Sig Dispense Auth. Provider   doxycycline (VIBRAMYCIN) 100 MG capsule Take 1 capsule (100 mg total) by mouth 2 (two) times daily for 7 days. 14 capsule Hall-Potvin, [place], PA-C     PDMP not reviewed this encounter.   Hall-Potvin, [place], [place]  02/03/20 1122

## 2020-02-03 NOTE — Telephone Encounter (Signed)
Sending to you, per our discussion.

## 2020-02-03 NOTE — Telephone Encounter (Signed)
I called and advised the patient's mom, Marcelino Duster.

## 2020-02-03 NOTE — Discharge Instructions (Addendum)
Keep area(s) clean and dry. °Apply hot compress / towel for 5-10 minutes 3-5 times daily. °Take antibiotic as prescribed with food - important to complete course. °Return for worsening pain, redness, swelling, discharge, fever. ° °Helpful prevention tips: °Keep nails short to avoid secondary skin infections. °Use new, clean razors when shaving. °Avoid antiperspirants - look for deodorants without aluminum. °Avoid wearing underwire bras as this can irritate the area further.  °

## 2020-02-03 NOTE — Telephone Encounter (Signed)
Pts mom called stating her son was referred to a dermatologist and wasn't be able to get an appt until 06/01/20 and she states his condition is getting worse she would like a CB in regards to trying to find a solution in more timely manner   [phone]

## 2020-02-16 ENCOUNTER — Encounter: Payer: Self-pay | Admitting: Emergency Medicine

## 2020-02-16 ENCOUNTER — Telehealth: Payer: Self-pay

## 2020-02-16 ENCOUNTER — Other Ambulatory Visit: Payer: Self-pay

## 2020-02-16 ENCOUNTER — Ambulatory Visit
Admission: EM | Admit: 2020-02-16 | Discharge: 2020-02-16 | Disposition: A | Discharge status: Home / Self Care | Payer: Self-pay

## 2020-02-16 DIAGNOSIS — L732 Hidradenitis suppurativa: Secondary | ICD-10-CM

## 2020-02-16 MED ORDER — PREDNISONE 10 MG PO TABS: ORAL_TABLET | ORAL | 0 refills | Status: AC

## 2020-02-16 MED ORDER — DOXYCYCLINE HYCLATE 100 MG PO TBEC: 100.0000 mg | DELAYED_RELEASE_TABLET | Freq: Two times a day (BID) | ORAL | 0 refills | Status: AC

## 2020-02-16 NOTE — ED Triage Notes (Addendum)
Patient c/o bilateral underarm and sacral pain x 1 week.   Patient denies fever at home.   Patient endorses a history of Hidradenitis suppurativa . Patient states he has a flare up.   Patient has used Doxycycline for symptoms.   Patient endorses being placed on Prednisone for Sciatic nerve problems.

## 2020-02-16 NOTE — Telephone Encounter (Signed)
Patients mom called she stated she is trying to get her sons blood work for the skin doctor she is requesting prednisone a week supply she stated prednisone is the only medication that seems to work and getting him up with no pain. [phone]

## 2020-02-16 NOTE — Telephone Encounter (Signed)
Rx sent 

## 2020-02-16 NOTE — Addendum Note (Signed)
Addended by: Lillia Carmel on: 02/16/2020 10:37 AM   Modules accepted: Orders

## 2020-02-16 NOTE — Telephone Encounter (Signed)
I called and advised Marcelino Duster.

## 2020-02-16 NOTE — ED Provider Notes (Signed)
[facility]    CSN: [id number] Arrival date & time: 02/16/20  1110      History   Chief Complaint Chief Complaint  Patient presents with  . Arm Pain    HPI Tommy Higgins is a 19 y.o. male  Presenting for HS flare.  States is been ongoing for the last few days.  Previously seen by me for this last month: Please see those records.  Tolerates doxycycline well: Requesting refill.  Since previous evaluation, patient has been seen by dermatology: Working with office and financial organization to cover Humira.  She also notes sciatica: Followed by Ortho for this.  Has tolerated prednisone well in the past: Requesting today.  Past Medical History:  Diagnosis Date  . Hidradenitis suppurativa     Patient Active Problem List   Diagnosis Date Noted  . Hydradenitis 12/25/2019    History reviewed. No pertinent surgical history.     Home Medications    Prior to Admission medications   Medication Sig Start Date End Date Taking? Authorizing Provider  doxycycline (DORYX) 100 MG EC tablet Take 1 tablet (100 mg total) by mouth 2 (two) times daily. 02/16/20   Hall-Potvin, [place], PA-C  predniSONE (DELTASONE) 10 MG tablet Take as directed for 12 days.  Daily dose 6,6,5,5,4,4,3,3,2,2,1,1. 02/16/20   Hilts, Casimiro Needle, MD  traMADol (ULTRAM) 50 MG tablet Take 1 tablet (50 mg total) by mouth 2 (two) times daily as needed. 02/03/20   Hilts, Casimiro Needle, MD    Family History History reviewed. No pertinent family history.  Social History Social History   Tobacco Use  . Smoking status: Never Smoker  . Smokeless tobacco: Never Used  Vaping Use  . Vaping Use: Never used  Substance Use Topics  . Alcohol use: Never  . Drug use: Never     Allergies   Patient has no known allergies.   Review of Systems Review of Systems  Constitutional: Negative for fatigue and fever.  Respiratory: Negative for cough and shortness of breath.   Cardiovascular: Negative for chest pain  and palpitations.  Gastrointestinal: Negative for abdominal pain, diarrhea and vomiting.  Musculoskeletal: Negative for arthralgias and myalgias.  Skin: Positive for rash and wound.  Neurological: Negative for speech difficulty and headaches.  All other systems reviewed and are negative.    Physical Exam Triage Vital Signs ED Triage Vitals  Enc Vitals Group     BP 02/16/20 1159 103/67     Pulse Rate 02/16/20 1159 (!) 107     Resp 02/16/20 1159 15     Temp 02/16/20 1159 98.3 F (36.8 C)     Temp Source 02/16/20 1159 Oral     SpO2 02/16/20 1159 96 %     Weight --      Height --      Head Circumference --      Peak Flow --      Pain Score 02/16/20 1157 8     Pain Loc --      Pain Edu? --      Excl. in GC? --    No data found.  Updated Vital Signs BP 103/67 (BP Location: Left Arm)   Pulse (!) 107   Temp 98.3 F (36.8 C) (Oral)   Resp 15   SpO2 96%   Visual Acuity Right Eye Distance:   Left Eye Distance:   Bilateral Distance:    Right Eye Near:   Left Eye Near:    Bilateral Near:  Physical Exam Constitutional:      General: He is not in acute distress. HENT:     Head: Normocephalic and atraumatic.  Eyes:     General: No scleral icterus.    Pupils: Pupils are equal, round, and reactive to light.  Cardiovascular:     Rate and Rhythm: Normal rate.  Pulmonary:     Effort: Pulmonary effort is normal. No respiratory distress.     Breath sounds: No wheezing.  Skin:    Coloration: Skin is not jaundiced or pale.  Neurological:     Mental Status: He is alert and oriented to person, place, and time.      UC Treatments / Results  Labs (all labs ordered are listed, but only abnormal results are displayed) Labs Reviewed - No data to display  EKG   Radiology No results found.  Procedures Procedures (including critical care time)  Medications Ordered in UC Medications - No data to display  Initial Impression / Assessment and Plan / UC Course  I  have reviewed the triage vital signs and the nursing notes.  Pertinent labs & imaging results that were available during my care of the patient were reviewed by me and considered in my medical decision making (see chart for details).     Patient appears well in office today: Declines exam due to discomfort when disrobing.  States that it looks similar to last time, please see those notes by me.  Will start doxycycline.  Ortho provider already called in prednisone: Informed patient.  We will continue follow-up with dermatology for management of  HS.  Return precautions discussed, pt verbalized understanding and is agreeable to plan. Final Clinical Impressions(s) / UC Diagnoses   Final diagnoses:  Hidradenitis suppurativa   Discharge Instructions   None    ED Prescriptions    Medication Sig Dispense Auth. Provider   doxycycline (DORYX) 100 MG EC tablet Take 1 tablet (100 mg total) by mouth 2 (two) times daily. 14 tablet Hall-Potvin, [place], PA-C     PDMP not reviewed this encounter.   Hall-Potvin, [place], [place] 02/16/20 1359

## 2020-02-16 NOTE — Telephone Encounter (Signed)
I called Mom, Tommy Higgins: the patient was seen by Dr. Melida Quitter at the [facility] on 02/06/20. He has prescribed Humira for the patient -- Mom is completing the patient assistance paperwork so that he can get this. She says Dr. Melida Quitter has ordered preliminary bloodwork (to check for TB, HIV, etc) and that will cost up near $700. She is going to [facility] to get [facility] tests done. The Humira will take 10-14 days to come in. Mom says the ordering doctor will not give him anything until the blood work is done. His pain is very bad today. She is asking if he can get a short amount of prednisone 1 last time, to get him through until the Humira comes in.  Please advise.

## 2020-05-04 ENCOUNTER — Ambulatory Visit: Payer: Self-pay | Admitting: Internal Medicine

## 2020-06-01 ENCOUNTER — Ambulatory Visit: Payer: Self-pay | Admitting: Dermatology

## 2021-08-27 IMAGING — MR MR LUMBAR SPINE W/O CM
4 of 5 series · 27 of 48 positions shown · non-contrast
Comparison: Prior radiograph from 12/15/2019.

CLINICAL DATA: Initial evaluation for lower back pain for greater
than 6 weeks.

EXAM:
MRI LUMBAR SPINE WITHOUT CONTRAST
TECHNIQUE: Multiplanar, multisequence MR imaging of the lumbar spine was
performed. No intravenous contrast was administered.

[Series 2: T2 · sagittal · 4.0mm · 1.09mm/px · 6 of 17 slices shown (1 of 2)]
[im 1/17]
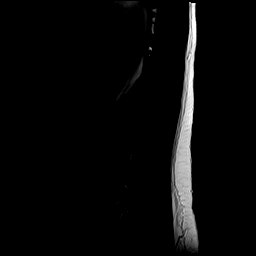
[im 4/17]
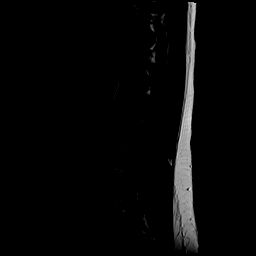
[im 7/17]
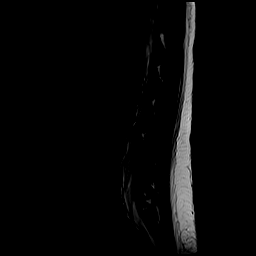
[im 10/17]
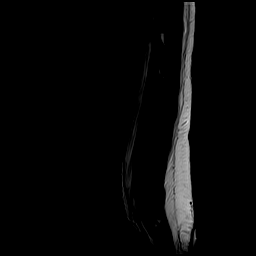
[im 13/17]
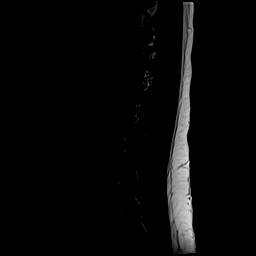
[im 17/17]
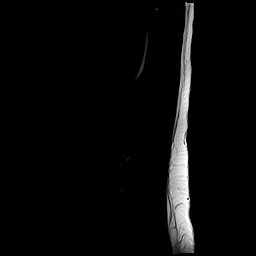

[Series 4: T1 · sagittal · 4.0mm · 1.09mm/px · 6 of 17 slices shown (1 of 2)]
[im 1/17]
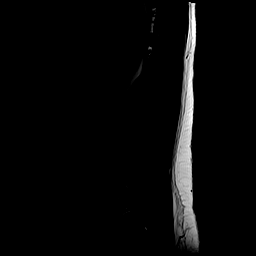
[im 4/17]
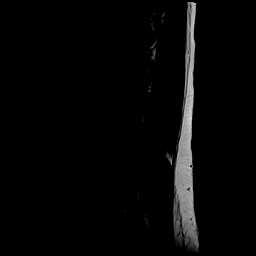
[im 7/17]
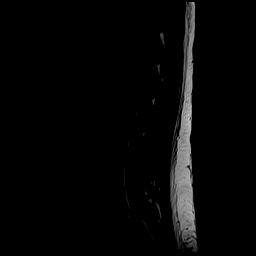
[im 10/17]
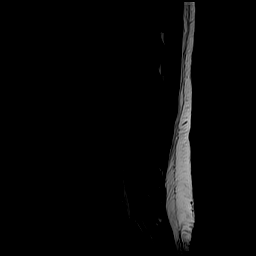
[im 13/17]
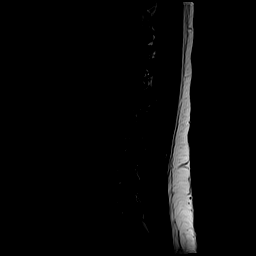
[im 17/17]
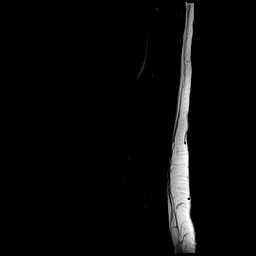

[Series 5: T2 · axial · 4.0mm · 0.39mm/px · z∈[-106,+102]mm · 9 of 42 slices shown (2 of 2)]
[im 1/42]
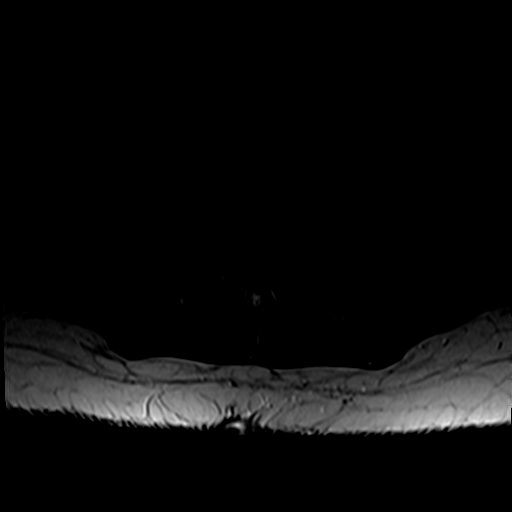
[im 6/42]
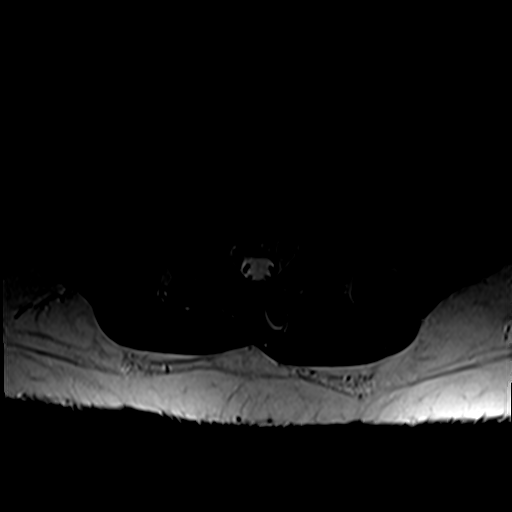
[im 12/42]
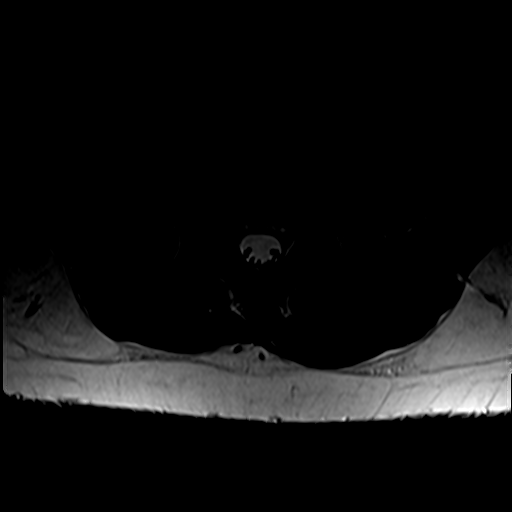
[im 18/42]
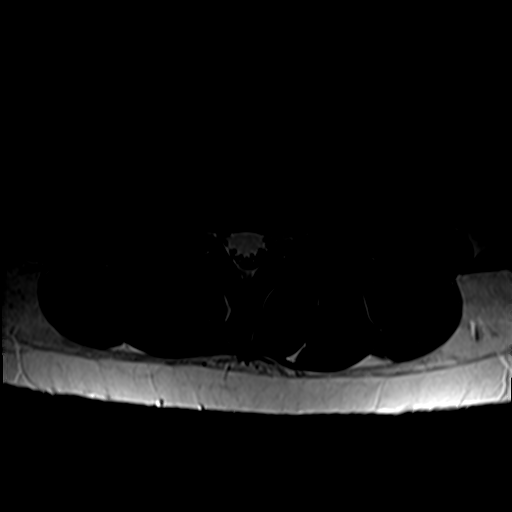
[im 21/42]
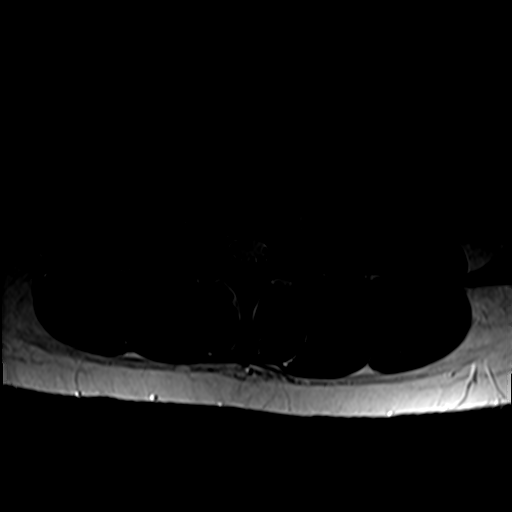
[im 24/42]
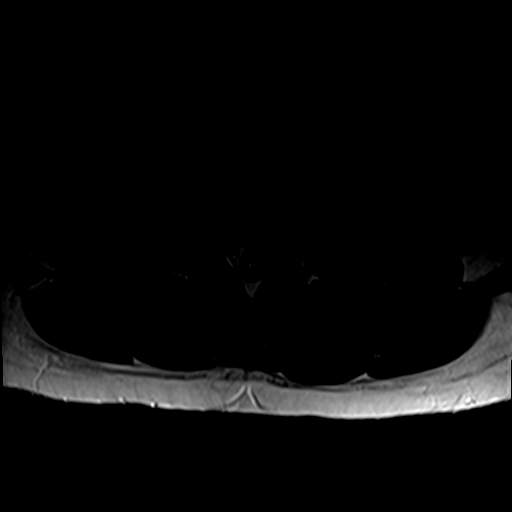
[im 30/42]
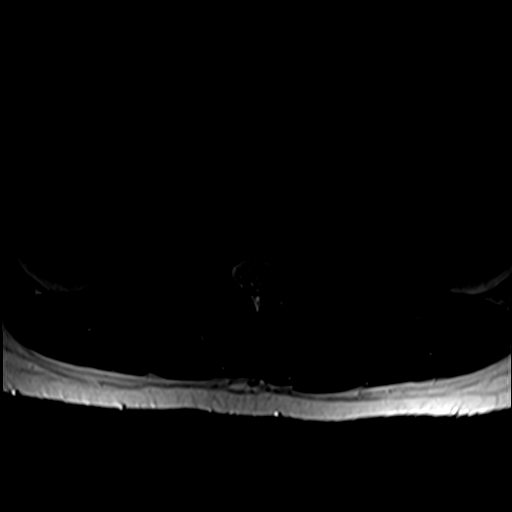
[im 36/42]
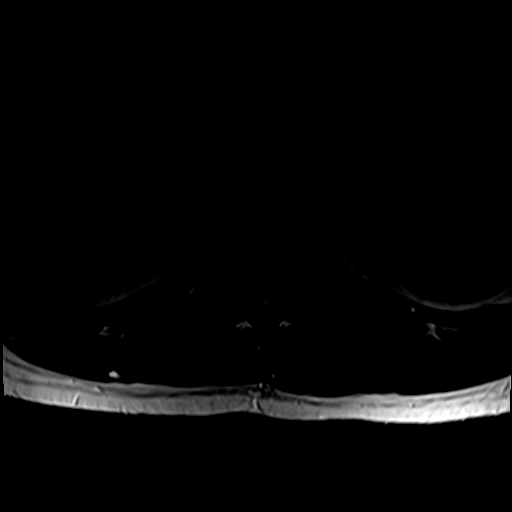
[im 42/42]
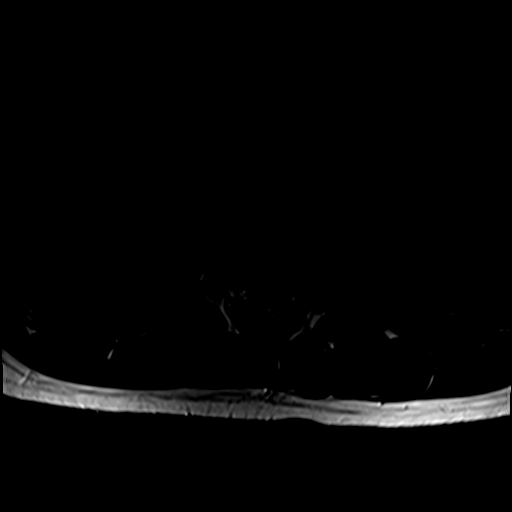

[Series 6: T1 · axial · 4.0mm · 0.39mm/px · z∈[-106,+74]mm · 6 of 42 slices shown (2 of 2)]
[im 1/42]
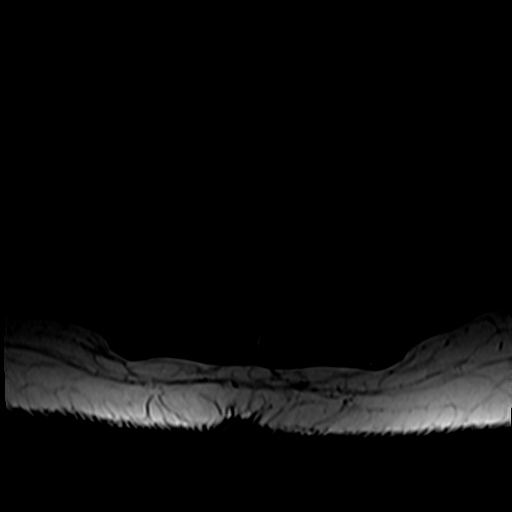
[im 6/42]
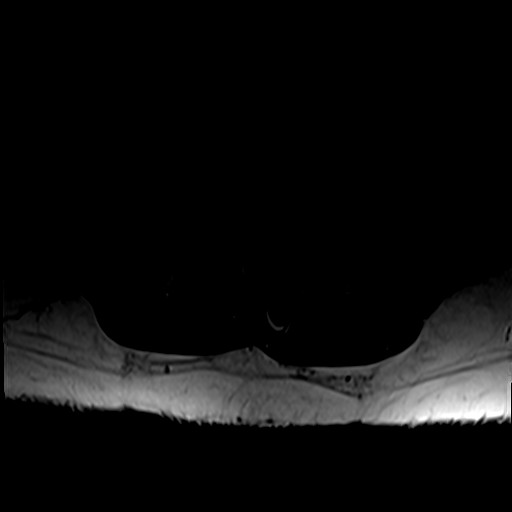
[im 12/42]
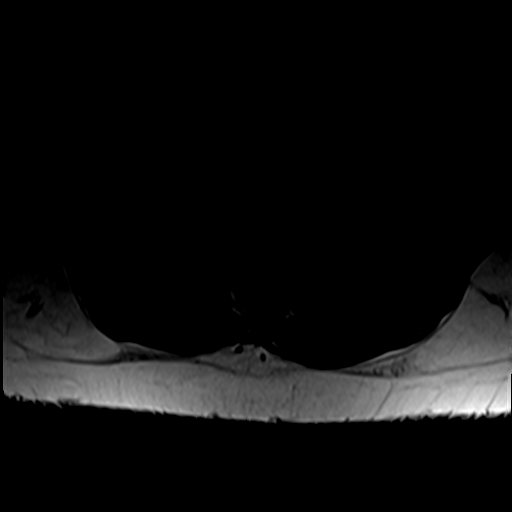
[im 18/42]
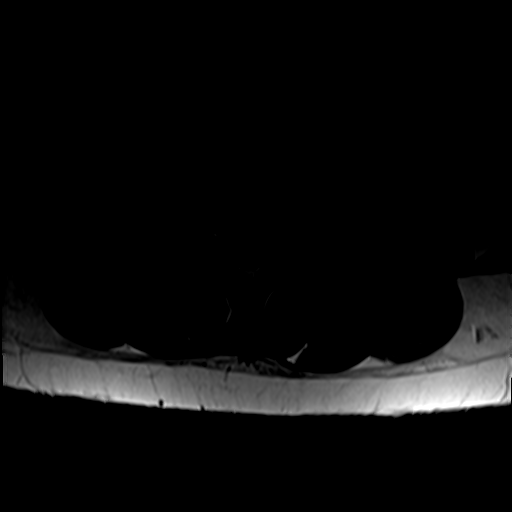
[im 21/42]
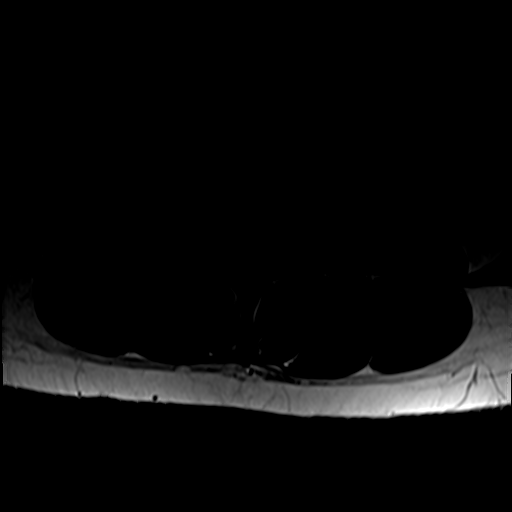
[im 36/42]
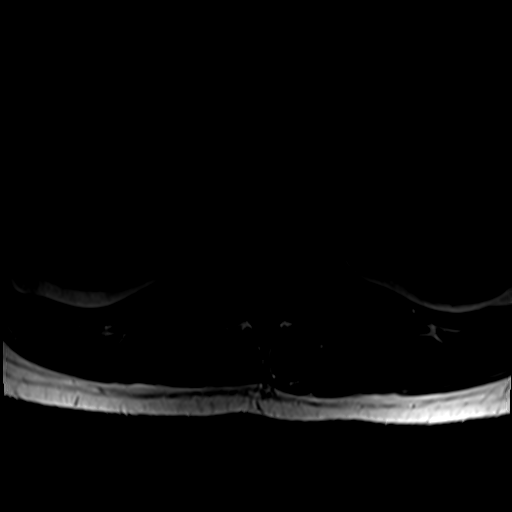

[27 of 48 positions shown; findings below may reference images not displayed]

FINDINGS: Segmentation: Standard. Lowest well-formed disc space labeled the
L5-S1 level.

Alignment: Physiologic with preservation of the normal lumbar
lordosis. No listhesis or static subluxation.

Vertebrae: Vertebral body height well maintained without acute or
chronic fracture. Bone marrow signal intensity within normal limits
for age. No discrete or worrisome osseous lesions. No abnormal
marrow edema to suggest acute stress reaction or stress response. No
visible pars defect.

Conus medullaris and cauda equina: Conus extends to the L1 level.
Conus and cauda equina appear normal.

Paraspinal and other soft tissues: Unremarkable.

Disc levels:

No significant disc pathology seen within the lumbar spine.
Intervertebral discs are well hydrated with preserved disc height.
No disc bulge or focal disc herniation. No significant facet
disease. No canal or neural foraminal stenosis. No neural
impingement.
IMPRESSION: Normal MRI of the lumbar spine.
